# Patient Record
Sex: Male | Born: 1970 | Race: White | Hispanic: No | Marital: Married | Smoking: Never smoker
Health system: Southern US, Community
[De-identification: ages and names within clinical notes are randomized; demographics above are authoritative.]

## PROBLEM LIST (undated history)

## (undated) DIAGNOSIS — N529 Male erectile dysfunction, unspecified: Secondary | ICD-10-CM

## (undated) DIAGNOSIS — E669 Obesity, unspecified: Secondary | ICD-10-CM

## (undated) DIAGNOSIS — R7301 Impaired fasting glucose: Secondary | ICD-10-CM

## (undated) DIAGNOSIS — I10 Essential (primary) hypertension: Secondary | ICD-10-CM

## (undated) DIAGNOSIS — G4733 Obstructive sleep apnea (adult) (pediatric): Secondary | ICD-10-CM

## (undated) DIAGNOSIS — R748 Abnormal levels of other serum enzymes: Secondary | ICD-10-CM

## (undated) DIAGNOSIS — E785 Hyperlipidemia, unspecified: Secondary | ICD-10-CM

## (undated) DIAGNOSIS — E039 Hypothyroidism, unspecified: Secondary | ICD-10-CM

## (undated) HISTORY — DX: Abnormal levels of other serum enzymes: R74.8

## (undated) HISTORY — DX: Hyperlipidemia, unspecified: E78.5

## (undated) HISTORY — DX: Impaired fasting glucose: R73.01

## (undated) HISTORY — DX: Male erectile dysfunction, unspecified: N52.9

## (undated) HISTORY — DX: Essential (primary) hypertension: I10

## (undated) HISTORY — DX: Obstructive sleep apnea (adult) (pediatric): G47.33

## (undated) HISTORY — DX: Hypothyroidism, unspecified: E03.9

## (undated) HISTORY — DX: Obesity, unspecified: E66.9

---

## 2003-11-13 ENCOUNTER — Encounter: Admission: RE | Admit: 2003-11-13 | Discharge: 2003-11-13 | Payer: Self-pay | Admitting: Family Medicine

## 2010-12-05 ENCOUNTER — Emergency Department (HOSPITAL_COMMUNITY)
Admission: EM | Admit: 2010-12-05 | Discharge: 2010-12-05 | Disposition: A | Discharge status: Home / Self Care | Payer: BC Managed Care – PPO | Attending: Emergency Medicine | Admitting: Emergency Medicine

## 2010-12-05 DIAGNOSIS — H8110 Benign paroxysmal vertigo, unspecified ear: Secondary | ICD-10-CM | POA: Insufficient documentation

## 2010-12-05 DIAGNOSIS — E7800 Pure hypercholesterolemia, unspecified: Secondary | ICD-10-CM | POA: Insufficient documentation

## 2010-12-05 DIAGNOSIS — I10 Essential (primary) hypertension: Secondary | ICD-10-CM | POA: Insufficient documentation

## 2010-12-05 DIAGNOSIS — H9209 Otalgia, unspecified ear: Secondary | ICD-10-CM | POA: Insufficient documentation

## 2010-12-05 DIAGNOSIS — Z79899 Other long term (current) drug therapy: Secondary | ICD-10-CM | POA: Insufficient documentation

## 2010-12-05 DIAGNOSIS — H9190 Unspecified hearing loss, unspecified ear: Secondary | ICD-10-CM | POA: Insufficient documentation

## 2010-12-05 DIAGNOSIS — R262 Difficulty in walking, not elsewhere classified: Secondary | ICD-10-CM | POA: Insufficient documentation

## 2010-12-05 DIAGNOSIS — H5500 Unspecified nystagmus: Secondary | ICD-10-CM | POA: Insufficient documentation

## 2010-12-05 DIAGNOSIS — R112 Nausea with vomiting, unspecified: Secondary | ICD-10-CM | POA: Insufficient documentation

## 2010-12-05 DIAGNOSIS — E039 Hypothyroidism, unspecified: Secondary | ICD-10-CM | POA: Insufficient documentation

## 2011-08-30 ENCOUNTER — Encounter: Payer: Self-pay | Admitting: Family Medicine

## 2011-08-31 ENCOUNTER — Ambulatory Visit (INDEPENDENT_AMBULATORY_CARE_PROVIDER_SITE_OTHER): Payer: BC Managed Care – PPO | Admitting: Family Medicine

## 2011-08-31 ENCOUNTER — Encounter: Payer: Self-pay | Admitting: Family Medicine

## 2011-08-31 VITALS — BP 128/82 | HR 84 | Ht 70.0 in | Wt 310.0 lb

## 2011-08-31 DIAGNOSIS — N529 Male erectile dysfunction, unspecified: Secondary | ICD-10-CM

## 2011-08-31 DIAGNOSIS — E7800 Pure hypercholesterolemia, unspecified: Secondary | ICD-10-CM

## 2011-08-31 DIAGNOSIS — E039 Hypothyroidism, unspecified: Secondary | ICD-10-CM

## 2011-08-31 DIAGNOSIS — I10 Essential (primary) hypertension: Secondary | ICD-10-CM

## 2011-08-31 LAB — COMPREHENSIVE METABOLIC PANEL
ALT: 39 U/L (ref 0–53)
AST: 38 U/L — ABNORMAL HIGH (ref 0–37)
Albumin: 4.7 g/dL (ref 3.5–5.2)
Alkaline Phosphatase: 50 U/L (ref 39–117)
Potassium: 4.2 mEq/L (ref 3.5–5.3)
Sodium: 138 mEq/L (ref 135–145)
Total Bilirubin: 0.9 mg/dL (ref 0.3–1.2)
Total Protein: 7.2 g/dL (ref 6.0–8.3)

## 2011-08-31 LAB — LIPID PANEL
Cholesterol: 195 mg/dL (ref 0–200)
Triglycerides: 149 mg/dL (ref <150)

## 2011-08-31 NOTE — Progress Notes (Signed)
Patient presents to re-establish care.  He has no specific concerns, other than needing bloodwork.  He will be needing refills soon (not sure), but has at least a month left of his current medications (gets from [facility], 90 day). He has been doing Crossfit, and on a diet of fruits, vegetables, nuts and seeds and meat--limited processed foods, sugars and carbs. He has lost 15 pounds so far, being back on this diet for 3 weeks. In the past he has gotten down to 288 with same diet.  Hypertension follow-up:  Blood pressures elsewhere are not being checked.  Denies dizziness, headaches, chest pain.  Denies side effects of medications. He used to have a BP monitor, but it broke. He plans to get a new one.  Hyperlipidemia follow-up:  Patient is reportedly following a low-fat, low cholesterol diet--but only for the last 3 weeks.  Compliant with medications and denies medication side effects.   He has had elevated liver enzymes in the past (felt to be due to fatty liver, ranging 40's to 60's, never higher).  Last check was normal in 04/2011.  Last lipids:  Chol 176, TG 123, LDL 118, HDL 35, ratio 5.03  Hypothyroidism follow-up:  Patient is compliant with medications, takes pill along with his other medications, and eats shortly afterwards.  Takes his vitamin after eating.  Denies hair/skin changes. Intentional weight loss.  Energy is good, no change in bowels  Past Medical History  Diagnosis Date  . Hypertension   . Hyperlipidemia   . ED (erectile dysfunction)   . Obesity   . Impaired fasting glucose   . Unspecified hypothyroidism   . Elevated liver enzymes     fatty liver on u/s in past    History reviewed. No pertinent past surgical history.  History   Social History  . Marital Status: Married    Spouse Name: N/A    Number of Children: 1  . 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 Smoking status: Never Smoker   .  Smokeless tobacco: Never Used  . Alcohol Use: Yes     2 drinks per year.  . Drug Use: No  . Sexually Active: Yes -- Male partner(s)   Other Topics Concern  . Not on file   Social History Narrative   Lives with wife, daughter and 1 dog    Family History  Problem Relation Age of Onset  . Cancer Mother     BREAST  . Hypertension Mother   . Hyperlipidemia Mother   . Cancer Paternal Grandfather     LUNG   . Diabetes Paternal Grandfather   . Hyperlipidemia Father   . Hypertension Father     Current outpatient prescriptions:atorvastatin (LIPITOR) 20 MG tablet, Take 1 tablet by mouth Daily., Disp: , Rfl: ;  fluticasone (FLONASE) 50 MCG/ACT nasal spray, Place 2 sprays into the nose daily.  , Disp: , Rfl: ;  levothyroxine (SYNTHROID, LEVOTHROID) 88 MCG tablet, Take 88 mcg by mouth daily.  , Disp: , Rfl: ;  lisinopril (PRINIVIL,ZESTRIL) 20 MG tablet, Take 20 mg by mouth daily.  , Disp: , Rfl:  Multiple Vitamins-Minerals (MULTIVITAMIN WITH MINERALS) tablet, Take 1 tablet by mouth daily.  , Disp: , Rfl: ;  sildenafil (VIAGRA) 100 MG tablet, Take 100 mg by mouth daily as needed.  , Disp: , Rfl:   No Known Allergies  ROS:  Denies fever, URI symptoms, thyroid  symptoms, headaches, dizziness, chest pain, shortness of breath, edema, skin concerns, depression or other problems.  See HPI  PHYSICAL EXAM: BP 128/82  Pulse 84  Ht 5\' 10"  (1.778 m)  Wt 310 lb (140.615 kg)  BMI 44.48 kg/m2 Pleasant, obese male in no distress HEENT: PERRL, EOMI, conjunctiva clear. OP clear Neck: no lymphadenopathy or thyromegaly Heart: regular rate and rhythm without murmur Lungs: clear bilaterally Back: no spine or CVA tenderness Abdomen: obese, soft, nontender, nondistended, no organomegaly or mass Extremities: no edema, 2+ pulse Skin: without rash Psych: normal mood, affect, hygiene and grooming  ASSESSMENT/PLAN: 1. Essential hypertension, benign  Comprehensive metabolic panel  2. Pure  hypercholesterolemia  Lipid panel  3. Unspecified hypothyroidism  TSH  4. Erectile dysfunction     Continue current meds, refill when needed  If lipids borderline, pt would like to give it 3 more months of dietary trial, since diet only changed about 3 weeks ago

## 2011-09-01 ENCOUNTER — Other Ambulatory Visit: Payer: Self-pay | Admitting: Family Medicine

## 2011-09-01 ENCOUNTER — Encounter: Payer: Self-pay | Admitting: Family Medicine

## 2011-09-01 DIAGNOSIS — E7800 Pure hypercholesterolemia, unspecified: Secondary | ICD-10-CM

## 2012-01-23 ENCOUNTER — Telehealth: Payer: Self-pay | Admitting: Family Medicine

## 2012-01-23 DIAGNOSIS — E7800 Pure hypercholesterolemia, unspecified: Secondary | ICD-10-CM

## 2012-01-23 DIAGNOSIS — I10 Essential (primary) hypertension: Secondary | ICD-10-CM

## 2012-01-23 DIAGNOSIS — N529 Male erectile dysfunction, unspecified: Secondary | ICD-10-CM

## 2012-01-23 DIAGNOSIS — E039 Hypothyroidism, unspecified: Secondary | ICD-10-CM

## 2012-01-23 MED ORDER — LISINOPRIL 20 MG PO TABS: 20.0000 mg | ORAL_TABLET | Freq: Every day | ORAL | Status: DC

## 2012-01-23 MED ORDER — ATORVASTATIN CALCIUM 20 MG PO TABS: 20.0000 mg | ORAL_TABLET | Freq: Every day | ORAL | Status: DC

## 2012-01-23 MED ORDER — LEVOTHYROXINE SODIUM 88 MCG PO TABS: 88.0000 ug | ORAL_TABLET | Freq: Every day | ORAL | Status: DC

## 2012-01-23 MED ORDER — SILDENAFIL CITRATE 100 MG PO TABS: 100.0000 mg | ORAL_TABLET | Freq: Every day | ORAL | Status: DC | PRN

## 2012-01-23 NOTE — Telephone Encounter (Signed)
Pt has a new member [facility] for [facility] [phone]  Pt needs refills on lipitor 20 mg levothyroxine 88 mcg lisinopril 20 mg and viagra 100 mg these [facility] he also stated that the [facility] needs to be [facility]. Pt states that  rx needs to be denied by [facility] first and then his ins will pay for a local pharm.

## 2012-01-23 NOTE — Telephone Encounter (Signed)
Review of chart shows that he was due to have his lipids repeated the end of February (has orders in computer).  He needs to schedule lab appt. rx's were sent as he requested.

## 2012-01-25 ENCOUNTER — Telehealth: Payer: Self-pay | Admitting: *Deleted

## 2012-01-25 NOTE — Telephone Encounter (Signed)
Called patient and he scheduled fasting lab appt for 8:30am 01/27/12.

## 2012-01-27 ENCOUNTER — Other Ambulatory Visit: Payer: BC Managed Care – PPO

## 2012-01-27 DIAGNOSIS — E7800 Pure hypercholesterolemia, unspecified: Secondary | ICD-10-CM

## 2012-01-28 LAB — HEPATIC FUNCTION PANEL
ALT: 46 U/L (ref 0–53)
Bilirubin, Direct: 0.1 mg/dL (ref 0.0–0.3)
Total Bilirubin: 0.4 mg/dL (ref 0.3–1.2)

## 2012-01-28 LAB — LIPID PANEL
Cholesterol: 278 mg/dL — ABNORMAL HIGH (ref 0–200)
LDL Cholesterol: 199 mg/dL — ABNORMAL HIGH (ref 0–99)
Total CHOL/HDL Ratio: 5.1 Ratio
VLDL: 25 mg/dL (ref 0–40)

## 2012-01-30 ENCOUNTER — Other Ambulatory Visit: Payer: Self-pay | Admitting: Internal Medicine

## 2012-01-30 ENCOUNTER — Telehealth: Payer: Self-pay | Admitting: Family Medicine

## 2012-01-30 DIAGNOSIS — E039 Hypothyroidism, unspecified: Secondary | ICD-10-CM

## 2012-01-30 DIAGNOSIS — I10 Essential (primary) hypertension: Secondary | ICD-10-CM

## 2012-01-30 DIAGNOSIS — E7800 Pure hypercholesterolemia, unspecified: Secondary | ICD-10-CM

## 2012-01-30 DIAGNOSIS — N529 Male erectile dysfunction, unspecified: Secondary | ICD-10-CM

## 2012-01-30 MED ORDER — LISINOPRIL 20 MG PO TABS: 20.0000 mg | ORAL_TABLET | Freq: Every day | ORAL | Status: DC

## 2012-01-30 MED ORDER — LEVOTHYROXINE SODIUM 88 MCG PO TABS: 88.0000 ug | ORAL_TABLET | Freq: Every day | ORAL | Status: DC

## 2012-01-30 MED ORDER — ATORVASTATIN CALCIUM 20 MG PO TABS: 20.0000 mg | ORAL_TABLET | Freq: Every day | ORAL | Status: DC

## 2012-01-30 NOTE — Telephone Encounter (Signed)
Pt aware that meds were sent to pharmacy for 3 week supply

## 2012-01-30 NOTE — Telephone Encounter (Signed)
LM

## 2012-01-30 NOTE — Telephone Encounter (Signed)
The [facility] was [facility] (he had asked me to send to both pharmacies with his original request).  3 weeks sent. We had generic in system for synthroid, make sure that is okay, and if so, sign rx's that are pended.  Thanks

## 2012-02-29 ENCOUNTER — Encounter: Payer: Self-pay | Admitting: Family Medicine

## 2012-02-29 ENCOUNTER — Ambulatory Visit (INDEPENDENT_AMBULATORY_CARE_PROVIDER_SITE_OTHER): Payer: BC Managed Care – PPO | Admitting: Family Medicine

## 2012-02-29 VITALS — BP 118/82 | HR 76 | Ht 70.0 in | Wt 317.0 lb

## 2012-02-29 DIAGNOSIS — E669 Obesity, unspecified: Secondary | ICD-10-CM

## 2012-02-29 DIAGNOSIS — B351 Tinea unguium: Secondary | ICD-10-CM

## 2012-02-29 DIAGNOSIS — E7800 Pure hypercholesterolemia, unspecified: Secondary | ICD-10-CM

## 2012-02-29 DIAGNOSIS — E039 Hypothyroidism, unspecified: Secondary | ICD-10-CM

## 2012-02-29 DIAGNOSIS — I10 Essential (primary) hypertension: Secondary | ICD-10-CM

## 2012-02-29 NOTE — Patient Instructions (Signed)
Return fasting in 6 weeks.  If liver tests are okay, and medications aren't being changed, we can consider trying oral lamisil (antifungal medication). If liver tests are elevated, I'd hold off on trying that.  Please make sure to take your medications every day.  Get at least 30-60 minutes of exercise daily, and follow a proper diet--small portion sizes, lowfat.

## 2012-02-29 NOTE — Progress Notes (Signed)
Chief Complaint  Patient presents with  . Med Check    6 month med check for HTN, pure hyperchol and hypothyroidism. Patient is fasting.   HPI: Hypertension follow-up:  Blood pressures are not checked elsewhere.  Denies dizziness, headaches, chest pain, edema.  Denies side effects of medications.  Hyperlipidemia follow-up:  Patient is reportedly following a low-fat, low cholesterol diet.  Only recently compliant with medications and denies medication side effects.  Last lipids were 4/26, when he had been out of his Lipitor (for about 3-4 weeks, LDL 199), as been back on med for about a month--as he initially stated, but then recalls that he didn't take it for two weeks while on vacation in [place] earlier this month. Prior to that, LDL was 129 and HDL 36 in November.  Hypothyroidism:  Last TSH was 2.604 in November.  He has gained 7 pounds since last visit.  Some dry patches of skin, and intermittent constipation  He reports having gotten down to 282 with Whole 30 diet and Crossfit.  He admits to getting in cycles of being compliant with diet and exercise, and then goes back to his normal routine.  Currently he is only walking the dog.  He doesn't remember taking any of his medications while he was in [place] for two weeks (May 4-17).    Left foot rash for years.  Nails on that foot are also brittle.  Antifungal creams made it worse.  Recalls being told about possibly considering oral antifungals, but liver tests had been elevated.  Last LFT's were normal, but he hadn't been on statin med at the time.  Lab Results  Component Value Date   CHOL 278* 01/27/2012   HDL 54 01/27/2012   LDLCALC 199* 01/27/2012   TRIG 125 01/27/2012   CHOLHDL 5.1 01/27/2012   Past Medical History  Diagnosis Date  . Hypertension   . Hyperlipidemia   . ED (erectile dysfunction)   . Obesity   . Impaired fasting glucose   . Unspecified hypothyroidism   . Elevated liver enzymes     fatty liver on u/s in past   History    Social History  . Marital Status: Married    Spouse Name: N/A    Number of Children: 1  . 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 Smoking status: Never Smoker   . Smokeless tobacco: Never Used  . Alcohol Use: Yes     2 drinks per year.  . Drug Use: No  . Sexually Active: Yes -- Male partner(s)   Other Topics Concern  . Not on file   Social History Narrative   Lives with wife, daughter and 1 dog   Current Outpatient Prescriptions on File Prior to Visit  Medication Sig Dispense Refill  . atorvastatin (LIPITOR) 20 MG tablet Take 1 tablet (20 mg total) by mouth daily.  21 tablet  0  . levothyroxine (SYNTHROID, LEVOTHROID) 88 MCG tablet Take 1 tablet (88 mcg total) by mouth daily.  21 tablet  0  . lisinopril (PRINIVIL,ZESTRIL) 20 MG tablet Take 1 tablet (20 mg total) by mouth daily.  21 tablet  0  . Multiple Vitamins-Minerals (MULTIVITAMIN WITH MINERALS) tablet Take 1 tablet by mouth daily.        . sildenafil (VIAGRA) 100 MG tablet Take 1 tablet (100 mg total) by mouth daily as needed.  10 tablet  11   No Known  Allergies  ROS:  Denies chest pain, headaches, palpitations, shortness of breath, edema.  +weight gain, constipation.  See HPI.  No fevers, URI symptoms  PHYSICAL EXAM: BP 118/82  Pulse 76  Ht 5\' 10"  (1.778 m)  Wt 317 lb (143.79 kg)  BMI 45.48 kg/m2 Well developed, pleasant obese male in no distress Neck: no lymphadenopathy or thyromegaly Heart: regular rate and rhythm.  Rate of 96 at time of MD exam. No murmur Lungs: clear bilaterally Abdomen: soft, obese, nontender Extremities: no edema, 2+ pulse. 1st and 5th nails on L foot discolored. Some flaking/dryness at heel, somewhat of a sandalfoot distribution but sparing metatarsal region. Skin otherwise free of rash.  ASSESSMENT/PLAN:  1. Essential hypertension, benign    2. Pure hypercholesterolemia  Lipid panel, Hepatic function panel  3.  Obesity  Glucose, random  4. Onychomycosis  Glucose, random  5. Unspecified hypothyroidism  TSH   Avoid checking labs today due to the possibility of 2 weeks of missed meds during the last month while on vacation.  Return in 6 weeks for TSH, lipids, LFTS.  Discussed healthy diet and lifestyle changes, rather than a "diet" per se.  Counseling given.  Possible tinea pedis and onychomycosis, failed OTC antifungal treatments.  Consider treatment with oral antifungals presumptively (given toenail appearance) vs skin scraping of dry skin at heel.  Had some elevated LFT's in past (most recent normal, but hadn't been on Lipitor)--await LFT's for treatment.  25 minutes face to face visit

## 2012-04-11 ENCOUNTER — Other Ambulatory Visit: Payer: BC Managed Care – PPO

## 2012-04-11 DIAGNOSIS — E039 Hypothyroidism, unspecified: Secondary | ICD-10-CM

## 2012-04-11 DIAGNOSIS — E7800 Pure hypercholesterolemia, unspecified: Secondary | ICD-10-CM

## 2012-04-11 DIAGNOSIS — E669 Obesity, unspecified: Secondary | ICD-10-CM

## 2012-04-11 DIAGNOSIS — B351 Tinea unguium: Secondary | ICD-10-CM

## 2012-04-11 LAB — LIPID PANEL
Cholesterol: 181 mg/dL (ref 0–200)
HDL: 31 mg/dL — ABNORMAL LOW (ref >39)
Triglycerides: 132 mg/dL (ref <150)
VLDL: 26 mg/dL (ref 0–40)

## 2012-04-11 LAB — HEPATIC FUNCTION PANEL
ALT: 34 U/L (ref 0–53)
Albumin: 4.6 g/dL (ref 3.5–5.2)
Alkaline Phosphatase: 50 U/L (ref 39–117)
Indirect Bilirubin: 0.6 mg/dL (ref 0.0–0.9)
Total Protein: 7.2 g/dL (ref 6.0–8.3)

## 2012-04-11 LAB — GLUCOSE, RANDOM: Glucose, Bld: 78 mg/dL (ref 70–99)

## 2012-04-16 ENCOUNTER — Encounter: Payer: Self-pay | Admitting: Family Medicine

## 2012-04-16 ENCOUNTER — Ambulatory Visit (INDEPENDENT_AMBULATORY_CARE_PROVIDER_SITE_OTHER): Payer: BC Managed Care – PPO | Admitting: Family Medicine

## 2012-04-16 VITALS — BP 124/90 | HR 68 | Ht 70.0 in | Wt 303.0 lb

## 2012-04-16 DIAGNOSIS — E7800 Pure hypercholesterolemia, unspecified: Secondary | ICD-10-CM

## 2012-04-16 NOTE — Progress Notes (Signed)
Chief Complaint  Patient presents with  . Follow-up    lab follow up, fasting.   HPI:  Patient presents to follow up on recent lipids.  Has been following a Paleo diet (low carb, admits to still not eating as much vegetables as he is supposed to), eating bacon daily. Eating red meat and eggs frequently. He has not been exercising. He has continued to lose weight, and feels good.  Past Medical History  Diagnosis Date  . Hypertension   . Hyperlipidemia   . ED (erectile dysfunction)   . Obesity   . Impaired fasting glucose   . Unspecified hypothyroidism   . Elevated liver enzymes     fatty liver on u/s in past   History   Social History  . Marital Status: Married    Spouse Name: N/A    Number of Children: 1  . 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 Smoking status: Never Smoker   . Smokeless tobacco: Never Used  . Alcohol Use: Yes     2 drinks per year.  . Drug Use: No  . Sexually Active: Yes -- Male partner(s)   Other Topics Concern  . Not on file   Social History Narrative   Lives with wife, daughter and 1 dog    Current Outpatient Prescriptions on File Prior to Visit  Medication Sig Dispense Refill  . atorvastatin (LIPITOR) 20 MG tablet Take 1 tablet (20 mg total) by mouth daily.  21 tablet  0  . levothyroxine (SYNTHROID, LEVOTHROID) 88 MCG tablet Take 1 tablet (88 mcg total) by mouth daily.  21 tablet  0  . lisinopril (PRINIVIL,ZESTRIL) 20 MG tablet Take 1 tablet (20 mg total) by mouth daily.  21 tablet  0  . Multiple Vitamins-Minerals (MULTIVITAMIN WITH MINERALS) tablet Take 1 tablet by mouth daily.        . sildenafil (VIAGRA) 100 MG tablet Take 1 tablet (100 mg total) by mouth daily as needed.  10 tablet  11   No Known Allergies  ROS:  Denies fever, URI symptoms, headaches, chest pain, myalgias, or other concerns  PHYSICAL EXAM: BP 124/90  Pulse 68  Ht 5\' 10"  (1.778 m)  Wt 303 lb  (137.44 kg)  BMI 43.48 kg/m2 Well developed, pleasant male, in no distress. Weight is down 14 pounds from last visit 02/29/12.  Lab Results  Component Value Date   CHOL 181 04/11/2012   HDL 31* 04/11/2012   LDLCALC 124* 04/11/2012   TRIG 132 04/11/2012   CHOLHDL 5.8 04/11/2012  chol/HDL ratio 5.8  Lab Results  Component Value Date   ALT 34 04/11/2012   AST 24 04/11/2012   ALKPHOS 50 04/11/2012   BILITOT 0.8 04/11/2012   ASSESSMENT/PLAN: 1. Pure hypercholesterolemia    Counseled extensively regarding diet and exercise.  He is losing weight on current diet by significantly decreasing his portion sizes and eliminating carbs, but still following high cholesterol diet (and high sodium, affecting his BP today). Discussed LDL, HDL at length.  Recommended doing [facility] panel, to help determine next step in treatment--either increasing lipitor, vs adding niaspan vs adding intestinal agents, depending on the results (particle size, etc.)  Encouraged daily exercise, low sodium diet, discussed some lower cholesterol high protein options.  Discussed fish in diet vs fish oil.  Will contact patient and adjust meds based on results.  He doesn't seem very willing to  make changes to his Paleo-like diet, given the weight loss results he has seen.  30 minute visit, all counseling.

## 2012-04-27 ENCOUNTER — Telehealth: Payer: Self-pay | Admitting: Family Medicine

## 2012-04-27 DIAGNOSIS — E039 Hypothyroidism, unspecified: Secondary | ICD-10-CM

## 2012-04-27 DIAGNOSIS — I10 Essential (primary) hypertension: Secondary | ICD-10-CM

## 2012-04-27 DIAGNOSIS — N529 Male erectile dysfunction, unspecified: Secondary | ICD-10-CM

## 2012-04-27 DIAGNOSIS — E7800 Pure hypercholesterolemia, unspecified: Secondary | ICD-10-CM

## 2012-04-27 MED ORDER — LEVOTHYROXINE SODIUM 88 MCG PO TABS: 88.0000 ug | ORAL_TABLET | Freq: Every day | ORAL | Status: DC

## 2012-04-27 MED ORDER — SILDENAFIL CITRATE 100 MG PO TABS: 100.0000 mg | ORAL_TABLET | Freq: Every day | ORAL | Status: DC | PRN

## 2012-04-27 MED ORDER — ATORVASTATIN CALCIUM 40 MG PO TABS: 40.0000 mg | ORAL_TABLET | Freq: Every day | ORAL | Status: DC

## 2012-04-27 MED ORDER — LISINOPRIL 20 MG PO TABS: 20.0000 mg | ORAL_TABLET | Freq: Every day | ORAL | Status: DC

## 2012-04-27 NOTE — Telephone Encounter (Signed)
[facility] results just came in--I haven't had a chance to fully review, but recall that chart of overproducer vs overabsorber was in the middle.  Since he is only on low dose lipitor, trial of increase to 40mg .  Schedule repeat lipids and lfts for 2 months.  Rx's sent to pharmacy.  He should have a lot of refills left on Viagra at local pharmacy, but [facility] per his request. I will review results in more detail on Monday--there was a book of results that is for him (unless they sent him a separate copy)

## 2012-04-27 NOTE — Telephone Encounter (Signed)
Pt needs refills on lipitor, lisinopril, viagra and levothyroxine  [facility] Pt states he has about a week of lipitor left, he stated he thinks you are waiting for [facility] results

## 2012-04-30 ENCOUNTER — Encounter: Payer: Self-pay | Admitting: Family Medicine

## 2012-04-30 ENCOUNTER — Telehealth: Payer: Self-pay | Admitting: *Deleted

## 2012-04-30 NOTE — Telephone Encounter (Signed)
Left messages on patient's home and cell number asking him to return my call.

## 2012-04-30 NOTE — Telephone Encounter (Signed)
Think this is for you.  

## 2012-05-01 ENCOUNTER — Encounter: Payer: Self-pay | Admitting: Family Medicine

## 2012-05-02 ENCOUNTER — Telehealth: Payer: Self-pay | Admitting: *Deleted

## 2012-05-02 ENCOUNTER — Other Ambulatory Visit: Payer: Self-pay | Admitting: *Deleted

## 2012-05-02 DIAGNOSIS — Z79899 Other long term (current) drug therapy: Secondary | ICD-10-CM

## 2012-05-02 DIAGNOSIS — E7800 Pure hypercholesterolemia, unspecified: Secondary | ICD-10-CM

## 2012-05-02 NOTE — Telephone Encounter (Signed)
Spoke with patient and went over lab results. He scheduled fasting lab appt for 07/04/12 @ 8:30am.

## 2012-05-15 ENCOUNTER — Telehealth: Payer: Self-pay

## 2012-05-15 NOTE — Telephone Encounter (Signed)
Pt got results from [facility] insulin level was 16 according to them that is high risk and he has not been eating sugar other fruits and vegetables and would like to know if any of his med's would be causing this

## 2012-05-15 NOTE — Telephone Encounter (Signed)
You can advise pt that elevated insulin level is a sign of prediabetes, insulin resistance.  Statins can raise a sugar only slightly--the most likely cause of elevated insulin level/pre-diabetes is his weight.  He should continue to avoid sugars/ carbs, exercise daily and continue to lose weight.

## 2012-05-15 NOTE — Telephone Encounter (Signed)
Pt was called back and informed word for word and verbalized understanding

## 2012-06-06 ENCOUNTER — Telehealth: Payer: Self-pay | Admitting: *Deleted

## 2012-06-06 MED ORDER — TADALAFIL 5 MG PO TABS: 5.0000 mg | ORAL_TABLET | Freq: Every day | ORAL | Status: DC

## 2012-06-06 NOTE — Telephone Encounter (Signed)
Patient called and states that he was taking Cialis daily and then it was switched to Viagra. Viagra is costing him $500 for 3 month rx. Can you please switch back to Cialis daily and send to [facility]? Thanks.

## 2012-06-06 NOTE — Telephone Encounter (Signed)
Spoke with patient and he verified that he was taking 5mg  daily. E rx'd #90 with 3 refills per Dr.Knapp to [facility].

## 2012-06-06 NOTE — Telephone Encounter (Signed)
cialis hasn't been rx'd since being on computer.  Was he taking 2.5mg  daily or 5mg  daily?  Please confirm dose, and okay to send for 90 days with 3 refills to pharmacy

## 2012-07-04 ENCOUNTER — Other Ambulatory Visit: Payer: BC Managed Care – PPO

## 2012-07-05 ENCOUNTER — Other Ambulatory Visit: Payer: BC Managed Care – PPO

## 2012-07-05 DIAGNOSIS — Z79899 Other long term (current) drug therapy: Secondary | ICD-10-CM

## 2012-07-05 DIAGNOSIS — E7800 Pure hypercholesterolemia, unspecified: Secondary | ICD-10-CM

## 2012-07-05 LAB — HEPATIC FUNCTION PANEL
Bilirubin, Direct: 0.1 mg/dL (ref 0.0–0.3)
Total Bilirubin: 0.6 mg/dL (ref 0.3–1.2)

## 2012-07-05 LAB — LIPID PANEL
Total CHOL/HDL Ratio: 3.7 Ratio
VLDL: 34 mg/dL (ref 0–40)

## 2012-07-06 ENCOUNTER — Encounter: Payer: Self-pay | Admitting: Family Medicine

## 2012-07-18 ENCOUNTER — Other Ambulatory Visit (INDEPENDENT_AMBULATORY_CARE_PROVIDER_SITE_OTHER): Payer: BC Managed Care – PPO

## 2012-07-18 DIAGNOSIS — Z23 Encounter for immunization: Secondary | ICD-10-CM

## 2012-10-13 ENCOUNTER — Other Ambulatory Visit: Payer: Self-pay | Admitting: Family Medicine

## 2012-10-16 ENCOUNTER — Telehealth: Payer: Self-pay | Admitting: Family Medicine

## 2012-10-16 DIAGNOSIS — I10 Essential (primary) hypertension: Secondary | ICD-10-CM

## 2012-10-16 DIAGNOSIS — E7800 Pure hypercholesterolemia, unspecified: Secondary | ICD-10-CM

## 2012-10-16 NOTE — Telephone Encounter (Signed)
PT STATES HE WAS TO COME IN TO FOLLOW UP ON HIS "LEVELS". I SCHEDULED A VISIT FOR HIM ON FEB 5. HE STATES THAT HE WOULD LIKE TO COME IN THAT Monday TO HAVE HIS LABS DRAWN. PLEASE INFORM PT IF THIS IS OK AND ORDERS NEED TO BE PUT IN SYSTEM.

## 2012-10-16 NOTE — Telephone Encounter (Signed)
He is due for OV (has been 6 months since his last OV, due for med check on HTN and lipids).  Lab orders were entered so that he can come prior to visit and get fasting labs done.

## 2012-11-05 ENCOUNTER — Other Ambulatory Visit: Payer: BC Managed Care – PPO

## 2012-11-07 ENCOUNTER — Ambulatory Visit: Payer: BC Managed Care – PPO | Admitting: Family Medicine

## 2012-12-10 ENCOUNTER — Other Ambulatory Visit: Payer: BC Managed Care – PPO

## 2012-12-12 ENCOUNTER — Ambulatory Visit: Payer: BC Managed Care – PPO | Admitting: Family Medicine

## 2013-01-14 ENCOUNTER — Other Ambulatory Visit: Payer: BC Managed Care – PPO

## 2013-01-16 ENCOUNTER — Ambulatory Visit: Payer: BC Managed Care – PPO | Admitting: Family Medicine

## 2013-02-18 ENCOUNTER — Encounter: Payer: Self-pay | Admitting: Family Medicine

## 2013-03-15 ENCOUNTER — Other Ambulatory Visit: Payer: Self-pay | Admitting: Family Medicine

## 2013-03-18 ENCOUNTER — Other Ambulatory Visit: Payer: Self-pay | Admitting: *Deleted

## 2013-03-18 ENCOUNTER — Telehealth: Payer: Self-pay | Admitting: Family Medicine

## 2013-03-18 DIAGNOSIS — E7800 Pure hypercholesterolemia, unspecified: Secondary | ICD-10-CM

## 2013-03-18 DIAGNOSIS — I10 Essential (primary) hypertension: Secondary | ICD-10-CM

## 2013-03-19 ENCOUNTER — Telehealth: Payer: Self-pay | Admitting: Family Medicine

## 2013-03-19 NOTE — Telephone Encounter (Signed)
LM

## 2013-03-20 ENCOUNTER — Other Ambulatory Visit: Payer: 59

## 2013-03-20 DIAGNOSIS — E7800 Pure hypercholesterolemia, unspecified: Secondary | ICD-10-CM

## 2013-03-20 DIAGNOSIS — I10 Essential (primary) hypertension: Secondary | ICD-10-CM

## 2013-03-20 LAB — LIPID PANEL
Cholesterol: 192 mg/dL (ref 0–200)
HDL: 38 mg/dL — ABNORMAL LOW (ref >39)
Triglycerides: 196 mg/dL — ABNORMAL HIGH (ref <150)

## 2013-03-20 LAB — COMPREHENSIVE METABOLIC PANEL
BUN: 15 mg/dL (ref 6–23)
CO2: 25 mEq/L (ref 19–32)
Calcium: 9.1 mg/dL (ref 8.4–10.5)
Chloride: 102 mEq/L (ref 96–112)
Creat: 0.92 mg/dL (ref 0.50–1.35)
Glucose, Bld: 100 mg/dL — ABNORMAL HIGH (ref 70–99)
Total Bilirubin: 0.8 mg/dL (ref 0.3–1.2)

## 2013-04-08 ENCOUNTER — Ambulatory Visit: Payer: Self-pay | Admitting: Family Medicine

## 2013-04-15 ENCOUNTER — Ambulatory Visit (INDEPENDENT_AMBULATORY_CARE_PROVIDER_SITE_OTHER): Payer: 59 | Admitting: Family Medicine

## 2013-04-15 ENCOUNTER — Encounter: Payer: Self-pay | Admitting: Family Medicine

## 2013-04-15 VITALS — BP 140/70 | HR 76 | Ht 70.0 in | Wt 331.0 lb

## 2013-04-15 DIAGNOSIS — E039 Hypothyroidism, unspecified: Secondary | ICD-10-CM

## 2013-04-15 DIAGNOSIS — R7301 Impaired fasting glucose: Secondary | ICD-10-CM

## 2013-04-15 DIAGNOSIS — E7800 Pure hypercholesterolemia, unspecified: Secondary | ICD-10-CM

## 2013-04-15 DIAGNOSIS — R7989 Other specified abnormal findings of blood chemistry: Secondary | ICD-10-CM

## 2013-04-15 DIAGNOSIS — I10 Essential (primary) hypertension: Secondary | ICD-10-CM

## 2013-04-15 DIAGNOSIS — E782 Mixed hyperlipidemia: Secondary | ICD-10-CM

## 2013-04-15 NOTE — Progress Notes (Signed)
Chief Complaint  Patient presents with  . Hypertension    nonfasting med check.    Hypertension follow-up:  Blood pressures are not checked elsewhere.  Denies dizziness, headaches, chest pain.  Denies side effects of medications.  Admits that diet has been poor, maybe having more salt, not watching sodium intake.  Hyperlipidemia follow-up:  Patient is compliant with medications and denies medication side effects.  Admits to poor diet, not following any particular lowfat or low cholesterol diet.  Admits to having an "all or nothing" mentality, and is currently not on any program. Recently he has been getting little to no exercise.  He actually recently saw psychologist to address these issues (all or nothing mentality, per pt).  Past Medical History  Diagnosis Date  . Hypertension   . Hyperlipidemia   . ED (erectile dysfunction)   . Obesity   . Impaired fasting glucose   . Unspecified hypothyroidism   . Elevated liver enzymes     fatty liver on u/s in past   History reviewed. No pertinent past surgical history. History   Social History  . Marital Status: Married    Spouse Name: N/A    Number of Children: 1  . 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 Smoking status: Never Smoker   . Smokeless tobacco: Never Used  . Alcohol Use: Yes     Comment: 2 drinks per year.  . Drug Use: No  . Sexually Active: Yes -- Male partner(s)   Other Topics Concern  . Not on file   Social History Narrative   Lives with wife, daughter and 1 dog   Current Outpatient Prescriptions on File Prior to Visit  Medication Sig Dispense Refill  . atorvastatin (LIPITOR) 40 MG tablet TAKE 1 TABLET DAILY  90 tablet  0  . levothyroxine (SYNTHROID, LEVOTHROID) 88 MCG tablet Take 1 tablet (88 mcg total) by mouth daily.  90 tablet  3  . lisinopril (PRINIVIL,ZESTRIL) 20 MG tablet Take 1 tablet (20 mg total) by mouth daily.  90 tablet  3  .  sildenafil (VIAGRA) 100 MG tablet Take 1 tablet (100 mg total) by mouth daily as needed.  30 tablet  3  . Multiple Vitamins-Minerals (MULTIVITAMIN WITH MINERALS) tablet Take 1 tablet by mouth daily.         No current facility-administered medications on file prior to visit.   No Known Allergies  ROS:  Denies headaches, dizziness, chest pain, edema, fevers, URI symptoms, skin rashes, GI complaints, bleeding, bruising or other concerns.  PHYSICAL EXAM: BP 140/70  Pulse 76  Ht 5\' 10"  (1.778 m)  Wt 331 lb (150.141 kg)  BMI 47.49 kg/m2 Morbidly obese, pleasant male in no distress Neck: no lymphadenopathy, thyromegaly or bruit Heart: regular rate and rhythm without murmur Lungs: clear bilaterally Abdomen: soft, obese, no mass, nontender Extremities: no edema, 2+ pulse Neuro: alert and oriented.  Normal gait, cranial nerves intact Psych: normal mood, affect, hygiene and grooming  Lab Results  Component Value Date   CHOL 192 03/20/2013   HDL 38* 03/20/2013   LDLCALC 115* 03/20/2013   TRIG 196* 03/20/2013   CHOLHDL 5.1 03/20/2013     Chemistry      Component Value Date/Time   NA 137 03/20/2013 0839   K 4.1 03/20/2013 0839   CL 102 03/20/2013 0839   CO2 25 03/20/2013 0839   BUN 15 03/20/2013 0839  CREATININE 0.92 03/20/2013 0839      Component Value Date/Time   CALCIUM 9.1 03/20/2013 0839   ALKPHOS 50 03/20/2013 0839   AST 38* 03/20/2013 0839   ALT 65* 03/20/2013 0839   BILITOT 0.8 03/20/2013 0839     Glucose 100  ASSESSMENT/PLAN:  Unspecified hypothyroidism - Plan: TSH  Impaired fasting glucose - Plan: Hemoglobin A1c, Glucose, random  Elevated LFTs - Plan: Hepatic function panel  Mixed hyperlipidemia - Plan: Lipid panel  Pure hypercholesterolemia  Essential hypertension, benign  Noncompliant with diet, exercise.  Discussed lifestyle modification that needs to be continued longterm, rather than being "on" a program, that he then gets "off".  Counseling will likely benefit  him, especially with upcoming stressor of only child leaving for college next month.  Counseled extensively re: diet, the need for daily exercise (at least 30 mins daily, even if not doing Crossfit or a particular program).  Over 25 min visit, mor than 1/2 spent in counseling. Refer to nutritionist, and f/u in 3 months  Include recent lipids and also [facility] with referral to [place] (or [facility])

## 2013-04-15 NOTE — Patient Instructions (Addendum)
Juan Santiago vs Juan Santiago for nutrition referral.  We will do referral--if [facility] doesn't take your insurance, will refer to [facility]. We need to work on LIFESTYLE modifications, rather than "diets".  Follow low sodium diet, and exercise at least 30 minutes daily.

## 2013-04-16 LAB — HEMOGLOBIN A1C: Hgb A1c MFr Bld: 5.1 % (ref <5.7)

## 2013-04-16 LAB — TSH: TSH: 3.681 u[IU]/mL (ref 0.350–4.500)

## 2013-05-20 ENCOUNTER — Ambulatory Visit: Payer: Self-pay | Admitting: Specialist

## 2013-05-20 LAB — COMPREHENSIVE METABOLIC PANEL
Alkaline Phosphatase: 62 U/L (ref 50–136)
Anion Gap: 7 (ref 7–16)
BUN: 10 mg/dL (ref 7–18)
Bilirubin,Total: 0.6 mg/dL (ref 0.2–1.0)
Calcium, Total: 9 mg/dL (ref 8.5–10.1)
Co2: 25 mmol/L (ref 21–32)
Glucose: 100 mg/dL — ABNORMAL HIGH (ref 65–99)
Potassium: 3.9 mmol/L (ref 3.5–5.1)
SGPT (ALT): 58 U/L (ref 12–78)
Sodium: 137 mmol/L (ref 136–145)
Total Protein: 7.5 g/dL (ref 6.4–8.2)

## 2013-05-20 LAB — CBC WITH DIFFERENTIAL/PLATELET
Basophil %: 0.4 %
Eosinophil %: 1.8 %
HGB: 16.3 g/dL (ref 13.0–18.0)
Lymphocyte #: 3.6 10*3/uL (ref 1.0–3.6)
MCH: 30.9 pg (ref 26.0–34.0)
MCHC: 35.6 g/dL (ref 32.0–36.0)
MCV: 87 fL (ref 80–100)
Monocyte #: 0.7 x10 3/mm (ref 0.2–1.0)
Monocyte %: 7.1 %
Neutrophil %: 56.9 %
RBC: 5.27 10*6/uL (ref 4.40–5.90)
RDW: 13.2 % (ref 11.5–14.5)
WBC: 10.6 10*3/uL (ref 3.8–10.6)

## 2013-05-20 LAB — IRON AND TIBC
Iron Bind.Cap.(Total): 306 ug/dL (ref 250–450)
Iron: 119 ug/dL (ref 65–175)
Unbound Iron-Bind.Cap.: 187 ug/dL

## 2013-05-20 LAB — TSH: Thyroid Stimulating Horm: 2.99 u[IU]/mL

## 2013-05-20 LAB — AMYLASE: Amylase: 41 U/L (ref 25–115)

## 2013-05-20 LAB — BILIRUBIN, DIRECT: Bilirubin, Direct: 0.1 mg/dL (ref 0.00–0.20)

## 2013-05-20 LAB — FOLATE: Folic Acid: 16.4 ng/mL (ref 3.1–100.0)

## 2013-05-20 LAB — APTT: Activated PTT: 27.1 secs (ref 23.6–35.9)

## 2013-05-20 LAB — PROTIME-INR: Prothrombin Time: 12.5 secs (ref 11.5–14.7)

## 2013-05-20 LAB — MAGNESIUM: Magnesium: 2 mg/dL

## 2013-05-20 LAB — LIPASE, BLOOD: Lipase: 175 U/L (ref 73–393)

## 2013-05-23 DIAGNOSIS — G4733 Obstructive sleep apnea (adult) (pediatric): Secondary | ICD-10-CM

## 2013-05-23 HISTORY — DX: Obstructive sleep apnea (adult) (pediatric): G47.33

## 2013-05-28 ENCOUNTER — Ambulatory Visit: Payer: Self-pay | Admitting: Specialist

## 2013-06-03 ENCOUNTER — Ambulatory Visit: Payer: Self-pay | Admitting: Specialist

## 2013-06-03 HISTORY — PX: ESOPHAGOGASTRODUODENOSCOPY: SHX1529

## 2013-06-17 ENCOUNTER — Encounter: Payer: Self-pay | Admitting: Family Medicine

## 2013-06-17 DIAGNOSIS — G4733 Obstructive sleep apnea (adult) (pediatric): Secondary | ICD-10-CM | POA: Insufficient documentation

## 2013-07-03 ENCOUNTER — Telehealth: Payer: Self-pay | Admitting: Internal Medicine

## 2013-07-03 NOTE — Telephone Encounter (Signed)
I'm happy to review his labs.  If the same tests that are ordered were done, we don't necessarily need to repeat.  LFT's were also ordered.  Await results to review.

## 2013-07-03 NOTE — Telephone Encounter (Signed)
Pt notified and will have to fill out a form for [place] to get those lab results.

## 2013-07-03 NOTE — Telephone Encounter (Signed)
Pt states he had a bunch of lab work done recently and took like 7 vials of blood but only knows of A1c and possibly thinks cholestrol. He is going to be sending over the lab results and he wants to know if you can look at them and find out if he still has to come in to get lab work drawn on 07/15/13.

## 2013-07-15 ENCOUNTER — Other Ambulatory Visit (INDEPENDENT_AMBULATORY_CARE_PROVIDER_SITE_OTHER): Payer: 59

## 2013-07-15 DIAGNOSIS — E782 Mixed hyperlipidemia: Secondary | ICD-10-CM

## 2013-07-15 DIAGNOSIS — Z23 Encounter for immunization: Secondary | ICD-10-CM

## 2013-07-15 DIAGNOSIS — R7301 Impaired fasting glucose: Secondary | ICD-10-CM

## 2013-07-15 LAB — LIPID PANEL
Cholesterol: 159 mg/dL (ref 0–200)
HDL: 36 mg/dL — ABNORMAL LOW (ref >39)
Total CHOL/HDL Ratio: 4.4 Ratio

## 2013-07-15 LAB — HEPATIC FUNCTION PANEL
ALT: 73 U/L — ABNORMAL HIGH (ref 0–53)
AST: 44 U/L — ABNORMAL HIGH (ref 0–37)
Albumin: 4.6 g/dL (ref 3.5–5.2)
Alkaline Phosphatase: 47 U/L (ref 39–117)
Bilirubin, Direct: 0.2 mg/dL (ref 0.0–0.3)
Indirect Bilirubin: 0.8 mg/dL (ref 0.0–0.9)
Total Bilirubin: 1 mg/dL (ref 0.3–1.2)

## 2013-07-15 LAB — GLUCOSE, RANDOM: Glucose, Bld: 93 mg/dL (ref 70–99)

## 2013-07-18 ENCOUNTER — Encounter: Payer: Self-pay | Admitting: Family Medicine

## 2013-07-18 ENCOUNTER — Ambulatory Visit (INDEPENDENT_AMBULATORY_CARE_PROVIDER_SITE_OTHER): Payer: 59 | Admitting: Family Medicine

## 2013-07-18 VITALS — BP 130/86 | HR 72 | Ht 70.0 in | Wt 342.0 lb

## 2013-07-18 DIAGNOSIS — R7989 Other specified abnormal findings of blood chemistry: Secondary | ICD-10-CM

## 2013-07-18 DIAGNOSIS — Z23 Encounter for immunization: Secondary | ICD-10-CM

## 2013-07-18 DIAGNOSIS — E7800 Pure hypercholesterolemia, unspecified: Secondary | ICD-10-CM

## 2013-07-18 DIAGNOSIS — I10 Essential (primary) hypertension: Secondary | ICD-10-CM

## 2013-07-18 NOTE — Progress Notes (Signed)
Chief Complaint  Patient presents with  . Hypertension    nonfasting med check ,labs already done.     Didn't see the nutritionist because insurance didn't cover.  He continued to see the psychologist, and attended a seminar with bariatric surgeons (in [place]). He has seen their nutritionist and their psychologist.  Had EGD with biopsy, EKG, lots of bloodwork, ultrasound of abdomen (confirmed fatty liver).  He had sleep study, diagnosed with OSD (obstructive sleep disorder) and treated with CPAP--doesn't notice any difference (pressure of 8 per pt) in how he feels since using machine.  He is on a "liver reduction diet"--shakes and products from [employer], with a lean meat and leafy green at night, x 4 weeks, then full liquid diet the week prior to surgery.  Surgery scheduled for 08/12/13.  He is having single anastomosis duodenal bypass with sleeve. Insurance requires him to have surgery at [facility])  Still getting individual counseling for anxiety, stress, coping  Past Medical History  Diagnosis Date  . Hypertension   . Hyperlipidemia   . ED (erectile dysfunction)   . Obesity   . Impaired fasting glucose   . Unspecified hypothyroidism   . Elevated liver enzymes     fatty liver on u/s in past   Past Surgical History  Procedure Laterality Date  . Esophagogastroduodenoscopy  06/2013    Dr. Smitty Cords [facility], [place])   Current outpatient prescriptions:atorvastatin (LIPITOR) 40 MG tablet, TAKE 1 TABLET DAILY, Disp: 90 tablet, Rfl: 0;  levothyroxine (SYNTHROID, LEVOTHROID) 88 MCG tablet, Take 1 tablet (88 mcg total) by mouth daily., Disp: 90 tablet, Rfl: 3;  lisinopril (PRINIVIL,ZESTRIL) 20 MG tablet, Take 1 tablet (20 mg total) by mouth daily., Disp: 90 tablet, Rfl: 3 Multiple Vitamins-Minerals (MULTIVITAMIN WITH MINERALS) tablet, Take 1 tablet by mouth daily.  , Disp: , Rfl: ;  sildenafil (VIAGRA) 100 MG tablet, Take 1 tablet (100 mg total) by mouth daily as  needed., Disp: 30 tablet, Rfl: 3  No Known Allergies  ROS:  Denies headaches, dizziness, chest pain, palpitations, shortness of breath, bleeding, bruising, rashes.  +stress.  No GI complaints.  No fevers, URI symptoms, cough. +weight gain since last visit  PHYSICAL EXAM: BP 130/86  Pulse 72  Ht 5\' 10"  (1.778 m)  Wt 342 lb (155.13 kg)  BMI 49.07 kg/m2 Pleasant, morbidly obese male in no distress Neck: no lymphadenopathy, thyromegaly or mass Heart: regular rate and rhythm without murmur Lungs: clear bilaterally Extremities: no edema Abdomen: obese, nontender  Lab Results  Component Value Date   CHOL 159 07/15/2013   HDL 36* 07/15/2013   LDLCALC 87 07/15/2013   TRIG 179* 07/15/2013   CHOLHDL 4.4 07/15/2013   Lab Results  Component Value Date   ALT 73* 07/15/2013   AST 44* 07/15/2013   ALKPHOS 47 07/15/2013   BILITOT 1.0 07/15/2013   Glucose 93  Lab Results  Component Value Date   TSH 3.681 04/15/2013   ASSESSMENT/PLAN:  Pure hypercholesterolemia - improved/stable.  borderline TG and low HDL.  LDL at goal.  continue current medication  Essential hypertension, benign - controlled  Elevated LFTs - related to fatty liver (confirmed on u/s, per pt)  Morbid obesity - bariatric surgery scheduled for next month   F/u 6 months for CPE Expect bariatric clinic to f/u on blood pressure, labs, etc so routine f/u not needed here until physical.  He will let us know if this is not the case

## 2013-07-23 ENCOUNTER — Telehealth: Payer: Self-pay | Admitting: Internal Medicine

## 2013-07-23 NOTE — Telephone Encounter (Signed)
Left a message for pt to call back

## 2013-07-23 NOTE — Telephone Encounter (Signed)
Pt states he called [facility] to get a bone density test done and they told him he would need a referral. He would like to get it done at [facility]

## 2013-07-23 NOTE — Telephone Encounter (Signed)
This was not being ordered by me; it is being requested by his bariatric physicians (it is really an order he needs, not a referral).  Records came in, but I haven't had a chance to review them yet. He can get order from the provider who is recommending the test, using the proper diagnosis code so that insurance covers it.  He can get the order rx from whomever, and still have the test done at [facility] (he can get written order, or they can fax it, doesn't have to be done thru computer).  If I order it, I will get the results, vs the person who is requesting the results (and also they will know the dx code to use to get it covered)

## 2013-07-23 NOTE — Telephone Encounter (Signed)
Pt notified that it needs to come from the provider who is requesting it. And to make sure they put the right dx code for insurance to cover it.

## 2013-08-07 ENCOUNTER — Encounter: Payer: Self-pay | Admitting: *Deleted

## 2013-08-12 HISTORY — PX: LAPAROSCOPIC GASTRIC SLEEVE RESECTION: SHX5895

## 2013-08-13 ENCOUNTER — Encounter: Payer: Self-pay | Admitting: Family Medicine

## 2013-08-14 ENCOUNTER — Encounter: Payer: Self-pay | Admitting: Family Medicine

## 2014-01-02 ENCOUNTER — Encounter: Payer: Self-pay | Admitting: Family Medicine

## 2014-01-02 ENCOUNTER — Ambulatory Visit (INDEPENDENT_AMBULATORY_CARE_PROVIDER_SITE_OTHER): Payer: 59 | Admitting: Family Medicine

## 2014-01-02 VITALS — BP 124/72 | HR 76 | Ht 70.0 in | Wt 281.0 lb

## 2014-01-02 DIAGNOSIS — R945 Abnormal results of liver function studies: Secondary | ICD-10-CM

## 2014-01-02 DIAGNOSIS — Z9884 Bariatric surgery status: Secondary | ICD-10-CM

## 2014-01-02 DIAGNOSIS — R7989 Other specified abnormal findings of blood chemistry: Secondary | ICD-10-CM

## 2014-01-02 DIAGNOSIS — E7800 Pure hypercholesterolemia, unspecified: Secondary | ICD-10-CM

## 2014-01-02 DIAGNOSIS — I10 Essential (primary) hypertension: Secondary | ICD-10-CM

## 2014-01-02 DIAGNOSIS — E039 Hypothyroidism, unspecified: Secondary | ICD-10-CM

## 2014-01-02 LAB — LIPID PANEL
CHOLESTEROL: 242 mg/dL — AB (ref 0–200)
HDL: 34 mg/dL — ABNORMAL LOW (ref >39)
LDL Cholesterol: 172 mg/dL — ABNORMAL HIGH (ref 0–99)
Total CHOL/HDL Ratio: 7.1 Ratio
Triglycerides: 179 mg/dL — ABNORMAL HIGH (ref <150)
VLDL: 36 mg/dL (ref 0–40)

## 2014-01-02 LAB — CBC WITH DIFFERENTIAL/PLATELET
Basophils Absolute: 0 10*3/uL (ref 0.0–0.1)
Basophils Relative: 0 % (ref 0–1)
Eosinophils Absolute: 0.2 10*3/uL (ref 0.0–0.7)
Eosinophils Relative: 2 % (ref 0–5)
HEMATOCRIT: 43.7 % (ref 39.0–52.0)
HEMOGLOBIN: 15.9 g/dL (ref 13.0–17.0)
Lymphocytes Relative: 45 % (ref 12–46)
Lymphs Abs: 3.6 10*3/uL (ref 0.7–4.0)
MCH: 30.2 pg (ref 26.0–34.0)
MCHC: 36.4 g/dL — ABNORMAL HIGH (ref 30.0–36.0)
MCV: 82.9 fL (ref 78.0–100.0)
MONO ABS: 0.6 10*3/uL (ref 0.1–1.0)
MONOS PCT: 8 % (ref 3–12)
NEUTROS ABS: 3.6 10*3/uL (ref 1.7–7.7)
Neutrophils Relative %: 45 % (ref 43–77)
Platelets: 279 10*3/uL (ref 150–400)
RBC: 5.27 MIL/uL (ref 4.22–5.81)
RDW: 13.3 % (ref 11.5–15.5)
WBC: 8.1 10*3/uL (ref 4.0–10.5)

## 2014-01-02 LAB — COMPREHENSIVE METABOLIC PANEL
ALBUMIN: 4.4 g/dL (ref 3.5–5.2)
ALT: 69 U/L — ABNORMAL HIGH (ref 0–53)
AST: 46 U/L — ABNORMAL HIGH (ref 0–37)
Alkaline Phosphatase: 66 U/L (ref 39–117)
BUN: 10 mg/dL (ref 6–23)
CALCIUM: 9.2 mg/dL (ref 8.4–10.5)
CO2: 23 meq/L (ref 19–32)
Chloride: 104 mEq/L (ref 96–112)
Creat: 0.69 mg/dL (ref 0.50–1.35)
GLUCOSE: 82 mg/dL (ref 70–99)
POTASSIUM: 3.7 meq/L (ref 3.5–5.3)
Sodium: 138 mEq/L (ref 135–145)
Total Bilirubin: 0.8 mg/dL (ref 0.2–1.2)
Total Protein: 6.9 g/dL (ref 6.0–8.3)

## 2014-01-02 LAB — AMYLASE: Amylase: 29 U/L (ref 0–105)

## 2014-01-02 LAB — PHOSPHORUS: Phosphorus: 3.9 mg/dL (ref 2.3–4.6)

## 2014-01-02 LAB — MAGNESIUM: Magnesium: 2 mg/dL (ref 1.5–2.5)

## 2014-01-02 LAB — IRON: IRON: 101 ug/dL (ref 42–165)

## 2014-01-02 NOTE — Patient Instructions (Signed)
Keep up the good work.  Try and start exercising 30 minutes daily.  We will be in touch with your results, and to let you know if you need to restart cholesterol or thyroid medication

## 2014-01-02 NOTE — Progress Notes (Signed)
Chief Complaint  Patient presents with  . Advice Only    requesting "Initial Bariatric Panel" form that he brought in. Also brought in a list that he has done some research on that he would like to have done in addition.    Bariatric surgery was November 10th.  He has lost about 70 pounds so far.  Denies any problems, side effects.  Only can tolerate smaller volumes of food.  Stools are looser consistency than previously, but no actual diarrhea.  No floating, foul-smelling foods.  No nausea or vomiting.  He brings in long list of labs that he is requesting--gotten from support groups. He also brings in prescription for labs==sheet from [facility]--he actuallly needs "post-op" bariatric panel, rather than the initial panel (he didn't realize the boxes were on the left of the desired tests)  He has been off Lipitor since the surgery, as recommended by his surgeons.  He tries to continue to make healthy choices, follow low cholesterol diet. Eggs make him nauseated, so not eating.  Having smaller portions of all foods, so cut back on cholesterol in diet.  He tends to eat more carbs than he would like, because he can tolerate it better (ie bagels). He is not currently getting any regular exercise.  Hypothyroidism:  He was told to stop his medication after surgery.  He denies any symptoms.  His original symptom related to thyroid was heart palpitations.  He denies skin/hair/mood/energy changes.  +weight loss due to surgery, as well as bowel changes related to surgery/diet.  Hypertension:  He has been off lisinopril since surgery.  He doesn't check his BP elsewhere.  Denies headaches, dizziness, chest pain.  Past Medical History  Diagnosis Date  . Hypertension   . Hyperlipidemia   . ED (erectile dysfunction)   . Obesity   . Impaired fasting glucose   . Unspecified hypothyroidism   . Elevated liver enzymes     fatty liver on u/s in past  . OSA (obstructive sleep apnea)  05/23/13    severe OSA-sleep study 05/23/13   Past Surgical History  Procedure Laterality Date  . Esophagogastroduodenoscopy  06/2013    Dr. Smitty Cords [facility], [place]),gastritis  . 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 Marital Status: Married    Spouse Name: N/A    Number of Children: 1  . 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 Smoking status: Never Smoker   . Smokeless tobacco: Never Used  . Alcohol Use: Yes     Comment: 2 drinks per year.  . Drug Use: No  . Sexual Activity: Yes    Partners: Female   Other Topics Concern  . Not on file   Social History Narrative   Lives with wife, daughter and 1 dog   Outpatient Encounter Prescriptions as of 01/02/2014  Medication Sig Note  . b complex vitamins tablet Take 1 tablet by mouth daily.   . Calcium-Magnesium-Vitamin D (CALCIUM 500 PO) Take 2 tablets by mouth daily.   . Cholecalciferol (VITAMIN D3 PO) Take 5,000 Int'l Units by mouth daily. 01/02/2014: Patch form  . Multiple Vitamins-Minerals (MULTIVITAMIN WITH MINERALS) tablet Take 1 tablet by mouth daily.     . vitamin B-12 (CYANOCOBALAMIN) 100 MCG tablet Take 100 mcg by mouth daily.   Marland Kitchen atorvastatin (LIPITOR) 40 MG tablet TAKE  1 TABLET DAILY 01/02/2014: Stopped after surgery in November  . levothyroxine (SYNTHROID, LEVOTHROID) 88 MCG tablet Take 1 tablet (88 mcg total) by mouth daily. 01/02/2014: He was told to stop his thyroid medication after surgery--none since November  . lisinopril (PRINIVIL,ZESTRIL) 20 MG tablet Take 1 tablet (20 mg total) by mouth daily. 01/02/2014: Stopped after bariatric surgery  . sildenafil (VIAGRA) 100 MG tablet Take 1 tablet (100 mg total) by mouth daily as needed. 01/02/2014: Not taking since prior to surgery   No Known Allergies  ROS:  +head congestion, runny nose recently.  Denies fevers, chills, cough, shortness of breath,  chest pain, nausea, vomiting, heartburn, black or bloody stools, diarrhea, urinary complaints.  Denies bleeding, bruising, joint pains, depression, anxiety, hair/skin changes, depression, anxiety or other complaints.  See HPI.  PHYSICAL EXAM: BP 124/72  Pulse 76  Ht 5\' 10"  (1.778 m)  Wt 281 lb (127.461 kg)  BMI 40.32 kg/m2 Well developed, pleasant, obese male in no distress HEENT:  PERRL, EOMI, conjunctiva clear, OP clear Neck: no lymphadenopathy, thyromegaly or mass Heart: regular rate and rhythm without murmur Lungs: clear bilaterally Abdomen: soft, nontender Extremities: no edema Neuro: alert and oriented, normal strength, gait Psych: normal mood, affect, hygiene and grooming Skin: no rash, bruising  ASSESSMENT/PLAN:  Essential hypertension, benign - Plan: Comprehensive metabolic panel  Pure hypercholesterolemia - Plan: Comprehensive metabolic panel, Lipid panel  Unspecified hypothyroidism - Plan: TSH  Elevated LFTs - Plan: Comprehensive metabolic panel  Bariatric surgery status - Plan: Comprehensive metabolic panel, CBC with Differential, Magnesium, Iron, Phosphorus, Folate, Vitamin B12, Vit D  25 hydroxy (rtn osteoporosis monitoring), Ferritin, Prealbumin, Vitamin B1, Amylase  Morbid obesity  Hypothyroidism--if TSH is abnormal, likely can restart at a lower dose (given lower weight). Hyperlipidemia--if cholesterol is improved and restarting meds aren't indicated, plan to recheck in 6 mos, when diet may be different HTN--well controlled, off medications  We reviewed recommended/indicated labs in detail, and why many are inappropriate that he was requesting (if screening tests are normal--ie ceruloplasmin, copper).  He understands (spent over 10 minutes reviewing the tests he was asking for, and why certain ones weren't appropriate), and he is comfortable with the plan to just do the f/u tests as recommended by his bariatric surgeons.  If he has more specific concerns and  requests for tests based on his concerns for malabsorption, he can address these with his surgeon.

## 2014-01-03 ENCOUNTER — Other Ambulatory Visit: Payer: Self-pay | Admitting: Family Medicine

## 2014-01-03 LAB — TSH: TSH: 2.49 u[IU]/mL (ref 0.350–4.500)

## 2014-01-03 LAB — VITAMIN D 25 HYDROXY (VIT D DEFICIENCY, FRACTURES): VIT D 25 HYDROXY: 31 ng/mL (ref 30–89)

## 2014-01-03 LAB — PREALBUMIN: Prealbumin: 21.4 mg/dL (ref 17.0–34.0)

## 2014-01-03 LAB — VITAMIN B12: VITAMIN B 12: 1808 pg/mL — AB (ref 211–911)

## 2014-01-03 LAB — FERRITIN: Ferritin: 285 ng/mL (ref 22–322)

## 2014-01-03 LAB — FOLATE: Folate: 15.8 ng/mL

## 2014-01-03 NOTE — Addendum Note (Signed)
Addended byJoselyn Arrow: Meilani Edmundson on: 01/03/2014 12:06 AM   Modules accepted: Orders

## 2014-01-06 LAB — VITAMIN B1: VITAMIN B1 (THIAMINE): 14 nmol/L (ref 8–30)

## 2014-01-13 ENCOUNTER — Telehealth: Payer: Self-pay | Admitting: Family Medicine

## 2014-01-13 NOTE — Telephone Encounter (Signed)
Ok to cancel.  Only thing that really isn't being addressed is his prostate exam, but that can wait. He can change his October visit to a physical instead

## 2014-01-13 NOTE — Telephone Encounter (Signed)
Pt states that he is unsure if he needs to keep cpe appt. He questions if it is still necessary due to the fact that he has had appts with you and now has a recheck in October. Pt states that he has to pay 100% of visits until deductible met. He would like to cancel if not completely necessary. Please advise pt.

## 2014-01-13 NOTE — Telephone Encounter (Signed)
Pt informed and appt changed to cpe per Dr. Delford FieldKnapp's instructions.

## 2014-01-16 ENCOUNTER — Encounter: Payer: 59 | Admitting: Family Medicine

## 2014-05-28 ENCOUNTER — Other Ambulatory Visit: Payer: Self-pay | Admitting: Family Medicine

## 2014-07-01 ENCOUNTER — Telehealth: Payer: Self-pay | Admitting: Family Medicine

## 2014-07-01 DIAGNOSIS — Z131 Encounter for screening for diabetes mellitus: Secondary | ICD-10-CM

## 2014-07-01 NOTE — Telephone Encounter (Signed)
Future order entered for fasting sugar

## 2014-07-01 NOTE — Telephone Encounter (Signed)
Pt called and stated he needs his blood sugar also tested for his job and wants that added to his lab orders

## 2014-07-07 ENCOUNTER — Telehealth: Payer: Self-pay | Admitting: Family Medicine

## 2014-07-07 ENCOUNTER — Telehealth: Payer: Self-pay | Admitting: *Deleted

## 2014-07-07 NOTE — Telephone Encounter (Signed)
Pt has appt on Thursday and wants to know if you want him to come in tomorrow for labs?  Pt ph [phone]

## 2014-07-07 NOTE — Telephone Encounter (Signed)
He has future orders in for lipids and LFT's that were due in July (3 month f/u after restarting lipitor).  He also has fasting sugar in system (due now).  Unless he has rx for labs that are needed related to his bariatric surgery, this is all I need at this point.  If he is having symptoms/problems, we can add labs on at his visit

## 2014-07-07 NOTE — Telephone Encounter (Signed)
Patient scheduled for CPE Thursday- Called to see if he can come in for labs tomorrow-saw some future orders in system, wasn't sure what else, if anything he will need. Please advise.

## 2014-07-07 NOTE — Telephone Encounter (Signed)
Sent to Dr.Knapp and will call him back once she answers me.

## 2014-07-07 NOTE — Telephone Encounter (Signed)
Called patient and scheduled him for tomorrow am for these 3 tests.

## 2014-07-08 ENCOUNTER — Other Ambulatory Visit: Payer: 59

## 2014-07-08 DIAGNOSIS — R7989 Other specified abnormal findings of blood chemistry: Secondary | ICD-10-CM

## 2014-07-08 DIAGNOSIS — E7800 Pure hypercholesterolemia, unspecified: Secondary | ICD-10-CM

## 2014-07-08 DIAGNOSIS — Z131 Encounter for screening for diabetes mellitus: Secondary | ICD-10-CM

## 2014-07-08 DIAGNOSIS — R945 Abnormal results of liver function studies: Secondary | ICD-10-CM

## 2014-07-08 LAB — LIPID PANEL
Cholesterol: 145 mg/dL (ref 0–200)
HDL: 45 mg/dL (ref >39)
LDL CALC: 74 mg/dL (ref 0–99)
Total CHOL/HDL Ratio: 3.2 Ratio
Triglycerides: 128 mg/dL (ref <150)
VLDL: 26 mg/dL (ref 0–40)

## 2014-07-08 LAB — HEPATIC FUNCTION PANEL
ALK PHOS: 77 U/L (ref 39–117)
ALT: 50 U/L (ref 0–53)
AST: 37 U/L (ref 0–37)
Albumin: 4.5 g/dL (ref 3.5–5.2)
BILIRUBIN TOTAL: 0.9 mg/dL (ref 0.2–1.2)
Bilirubin, Direct: 0.2 mg/dL (ref 0.0–0.3)
Indirect Bilirubin: 0.7 mg/dL (ref 0.2–1.2)
Total Protein: 7.2 g/dL (ref 6.0–8.3)

## 2014-07-08 LAB — GLUCOSE, RANDOM: Glucose, Bld: 90 mg/dL (ref 70–99)

## 2014-07-10 ENCOUNTER — Ambulatory Visit (INDEPENDENT_AMBULATORY_CARE_PROVIDER_SITE_OTHER): Payer: 59 | Admitting: Family Medicine

## 2014-07-10 ENCOUNTER — Encounter: Payer: Self-pay | Admitting: Family Medicine

## 2014-07-10 VITALS — BP 118/80 | HR 74 | Ht 69.0 in | Wt 265.0 lb

## 2014-07-10 DIAGNOSIS — Z0000 Encounter for general adult medical examination without abnormal findings: Secondary | ICD-10-CM

## 2014-07-10 DIAGNOSIS — Z23 Encounter for immunization: Secondary | ICD-10-CM

## 2014-07-10 DIAGNOSIS — E7800 Pure hypercholesterolemia, unspecified: Secondary | ICD-10-CM

## 2014-07-10 DIAGNOSIS — I10 Essential (primary) hypertension: Secondary | ICD-10-CM

## 2014-07-10 DIAGNOSIS — E039 Hypothyroidism, unspecified: Secondary | ICD-10-CM

## 2014-07-10 MED ORDER — ATORVASTATIN CALCIUM 40 MG PO TABS: ORAL_TABLET | ORAL | Status: DC

## 2014-07-10 NOTE — Patient Instructions (Addendum)
Continue your current medication.  It is recommended that you get at least 30 minutes of aerobic exercise at least 5 days/week (for weight loss, you may need as much as 60-90 minutes). This can be any activity that gets your heart rate up. This can be divided in 10-15 minute intervals if needed, but try and build up your endurance at least once a week.  Weight bearing exercise is also recommended twice weekly.  Here is your information from your visit and labs:   BP 118/80  Pulse 74  Ht 5\' 9"  (1.753 m)  Wt 265 lb (120.203 kg)  BMI 39.12 kg/m2  Lab Results  Component Value Date   CHOL 145 07/08/2014   HDL 45 07/08/2014   LDLCALC 74 07/08/2014   TRIG 128 07/08/2014   CHOLHDL 3.2 07/08/2014   Lab Results  Component Value Date   ALT 50 07/08/2014   AST 37 07/08/2014   ALKPHOS 77 07/08/2014   BILITOT 0.9 07/08/2014   Fasting glucose 90

## 2014-07-10 NOTE — Progress Notes (Signed)
Chief Complaint  Patient presents with  . Annual Exam    patient was not aware he was having a cpe and he said he was not prepared he would like to talk to you first  . Hyperlipidemia   Patient presents to follow up on blood pressure and cholesterol. He is in need of a form for his insurance (does not require physical exam, just BP, height/weight/glucose).  When he called to schedule visit for form, we saw that he was due for physical.  Apparently he wasn't aware that physical was scheduled for today, is "not prepared" and prefers just to have med check.  He continues to lose weight s/p bariatric surgery.  He has lost another 16 pounds in the last 6 months.  He feels well, denies any complications or problems. No nausea, vomiting.  He has 1 year f/u with surgeons next month.  Hyperlipidemia:  lipitor was restarted after last labs--denies any side effects.  Following low-fat, low cholesterol diet.  He doesn't tolerate dairy well, so limits cheese/milk.  Rare red meat.  He doesn't tolerate tougher proteins (okay with filet mignon, dark meat chicken; doesn't do well with overcooked chicken breast).  So he admits to eating more carbs--easier to digest.  Hypertension:  He has been off blood pressure medication since just prior to his weight loss surgery.  Doesn't check BP elsewhere.  Denies headaches, dizziness, palpitations, chest pain, shortness of breath, edema  Hypothyroidism:  He has been off thyroid medication since prior to surgery. Denies any symptoms.  Past Medical History  Diagnosis Date  . Hypertension   . Hyperlipidemia   . ED (erectile dysfunction)   . Obesity   . Impaired fasting glucose   . Unspecified hypothyroidism   . Elevated liver enzymes     fatty liver on u/s in past  . OSA (obstructive sleep apnea) 05/23/13    severe OSA-sleep study 05/23/13   Past Surgical History  Procedure Laterality Date  . Esophagogastroduodenoscopy  06/2013    Dr. Darnell Level [facility], [place]),gastritis  .  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 Marital Status: Married    Spouse Name: N/A    Number of Children: 1  . Years of Education: N/A   Occupational History  . 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 Smoking status: Never Smoker   . Smokeless tobacco: Never Used  . Alcohol Use: Yes     Comment: 2 drinks per year.  . Drug Use: No  . Sexual Activity: Yes    Partners: Female   Other Topics Concern  . Not on file   Social History Narrative   Lives with wife, daughter and 1 dog   Current Outpatient Prescriptions on File Prior to Visit  Medication Sig Dispense Refill  . b complex vitamins tablet Take 1 tablet by mouth daily.      . Calcium-Magnesium-Vitamin D (CALCIUM 500 PO) Take 2 tablets by mouth daily.      . Cholecalciferol (VITAMIN D3 PO) Take 5,000 Int'l Units by mouth daily.      . Multiple Vitamins-Minerals (MULTIVITAMIN WITH MINERALS) tablet Take 1 tablet by mouth daily.         No current facility-administered medications on file prior to visit.  Atorvastatin 58m daily.  No Known Allergies  ROS:  Denies headaches, dizziness, numbness/tingling, syncope. He denies URI or allergy symptoms, cough, shortness of breath.  No chest pain, dysphagia, heartburn, bowel changes, GI or GU complaints. No bleeding, bruising, rashes. No depression/anxiety; denies pain, or any other concerns.  PHYSICAL EXAM:  BP 118/80  Pulse 74  Ht _0  (1.753 m)  Wt 265 lb (120.203 kg)  BMI 39.12 kg/m2  Well developed, pleasant, obese male in no distress HEENT: PERRL, EOMI, conjunctiva and sclera are clear. OP clear Neck: no lymphadenopathy, thyromegaly or mass Heart: regular rate and rhythm without murmur Lungs: clear bilaterally Back: no CVA tenderness Abdomen: soft, nontender, no organomegaly or mass Extremities: no edema Neuro: alert and oriented.  Cranial nerves intact. Normal gait,  strength Psych: normal mood, affect, hygiene and grooming Skin: no lesions/rashes noted.  Lab Results  Component Value Date   CHOL 145 07/08/2014   HDL 45 07/08/2014   LDLCALC 74 07/08/2014   TRIG 128 07/08/2014   CHOLHDL 3.2 07/08/2014   Lab Results  Component Value Date   ALT 50 07/08/2014   AST 37 07/08/2014   ALKPHOS 77 07/08/2014   BILITOT 0.9 07/08/2014   Glucose 90  ASSESSMENT/PLAN:  Pure hypercholesterolemia - well controlled on lipitor 58m; tolerating without side effects - Plan: atorvastatin (LIPITOR) 40 MG tablet  Need for prophylactic vaccination and inoculation against influenza - Plan: Flu Vaccine QUAD 36+ mos PF IM (Fluarix Quad PF)  Essential hypertension, benign - resolved s/p bariatric surgery/weight loss  Hypothyroidism, unspecified hypothyroidism type - off medications with normal TSH 6 months ago.  no symptoms to warrant recheck at this time. plan to check yearly (April), sooner prn sx  Reviewed low cholesterol diet in detail.  Ideally he should cut back on portions of carbs.  Regular exercise encouraged.   CPE in April, along with med check.  Fasting labs prior (c-met, lipid, TSH, CBC0  Pt to get uKoreacopies of any labs done through surgeons to avoid duplication

## 2015-01-09 ENCOUNTER — Other Ambulatory Visit: Payer: 59

## 2015-01-09 DIAGNOSIS — E039 Hypothyroidism, unspecified: Secondary | ICD-10-CM

## 2015-01-09 DIAGNOSIS — E7800 Pure hypercholesterolemia, unspecified: Secondary | ICD-10-CM

## 2015-01-09 DIAGNOSIS — I10 Essential (primary) hypertension: Secondary | ICD-10-CM

## 2015-01-09 DIAGNOSIS — Z0000 Encounter for general adult medical examination without abnormal findings: Secondary | ICD-10-CM

## 2015-01-09 LAB — CBC WITH DIFFERENTIAL/PLATELET
BASOS PCT: 0 % (ref 0–1)
Basophils Absolute: 0 10*3/uL (ref 0.0–0.1)
Eosinophils Absolute: 0.3 10*3/uL (ref 0.0–0.7)
Eosinophils Relative: 4 % (ref 0–5)
HEMATOCRIT: 44.1 % (ref 39.0–52.0)
Hemoglobin: 15.5 g/dL (ref 13.0–17.0)
Lymphocytes Relative: 37 % (ref 12–46)
Lymphs Abs: 2.8 10*3/uL (ref 0.7–4.0)
MCH: 30.2 pg (ref 26.0–34.0)
MCHC: 35.1 g/dL (ref 30.0–36.0)
MCV: 85.8 fL (ref 78.0–100.0)
MONOS PCT: 7 % (ref 3–12)
MPV: 9.3 fL (ref 8.6–12.4)
Monocytes Absolute: 0.5 10*3/uL (ref 0.1–1.0)
NEUTROS ABS: 4 10*3/uL (ref 1.7–7.7)
NEUTROS PCT: 52 % (ref 43–77)
Platelets: 245 10*3/uL (ref 150–400)
RBC: 5.14 MIL/uL (ref 4.22–5.81)
RDW: 13.5 % (ref 11.5–15.5)
WBC: 7.6 10*3/uL (ref 4.0–10.5)

## 2015-01-09 LAB — LIPID PANEL
CHOL/HDL RATIO: 3.6 ratio
CHOLESTEROL: 143 mg/dL (ref 0–200)
HDL: 40 mg/dL (ref >=40)
LDL Cholesterol: 82 mg/dL (ref 0–99)
Triglycerides: 105 mg/dL (ref <150)
VLDL: 21 mg/dL (ref 0–40)

## 2015-01-09 LAB — COMPREHENSIVE METABOLIC PANEL
ALBUMIN: 4.1 g/dL (ref 3.5–5.2)
ALT: 43 U/L (ref 0–53)
AST: 28 U/L (ref 0–37)
Alkaline Phosphatase: 64 U/L (ref 39–117)
BUN: 11 mg/dL (ref 6–23)
CALCIUM: 8.8 mg/dL (ref 8.4–10.5)
CO2: 26 mEq/L (ref 19–32)
Chloride: 99 mEq/L (ref 96–112)
Creat: 0.78 mg/dL (ref 0.50–1.35)
GLUCOSE: 81 mg/dL (ref 70–99)
Potassium: 3.9 mEq/L (ref 3.5–5.3)
Sodium: 137 mEq/L (ref 135–145)
Total Bilirubin: 0.8 mg/dL (ref 0.2–1.2)
Total Protein: 6.3 g/dL (ref 6.0–8.3)

## 2015-01-09 LAB — TSH: TSH: 3.899 u[IU]/mL (ref 0.350–4.500)

## 2015-01-12 ENCOUNTER — Encounter: Payer: Self-pay | Admitting: Family Medicine

## 2015-01-12 ENCOUNTER — Other Ambulatory Visit: Payer: Self-pay | Admitting: *Deleted

## 2015-01-12 ENCOUNTER — Ambulatory Visit (INDEPENDENT_AMBULATORY_CARE_PROVIDER_SITE_OTHER): Payer: 59 | Admitting: Family Medicine

## 2015-01-12 VITALS — BP 128/72 | HR 68 | Ht 69.5 in | Wt 252.2 lb

## 2015-01-12 DIAGNOSIS — E7800 Pure hypercholesterolemia, unspecified: Secondary | ICD-10-CM

## 2015-01-12 DIAGNOSIS — E039 Hypothyroidism, unspecified: Secondary | ICD-10-CM

## 2015-01-12 DIAGNOSIS — G4733 Obstructive sleep apnea (adult) (pediatric): Secondary | ICD-10-CM

## 2015-01-12 DIAGNOSIS — Z0000 Encounter for general adult medical examination without abnormal findings: Secondary | ICD-10-CM

## 2015-01-12 DIAGNOSIS — R7301 Impaired fasting glucose: Secondary | ICD-10-CM | POA: Diagnosis not present

## 2015-01-12 LAB — POCT URINALYSIS DIPSTICK
Bilirubin, UA: NEGATIVE
Blood, UA: NEGATIVE
Glucose, UA: NEGATIVE
Ketones, UA: NEGATIVE
LEUKOCYTES UA: NEGATIVE
Nitrite, UA: NEGATIVE
Protein, UA: NEGATIVE
Spec Grav, UA: 1.015
Urobilinogen, UA: NEGATIVE
pH, UA: 6

## 2015-01-12 MED ORDER — ATORVASTATIN CALCIUM 40 MG PO TABS: ORAL_TABLET | ORAL | Status: DC

## 2015-01-12 NOTE — Progress Notes (Signed)
Chief Complaint  Patient presents with  . Annual Exam    nonfasting annual exam/med check, labs already one. Did not do eye exam-he had one with Dr.Fox last week. No concerns.   Juan Santiago is a 44 y.o. male who presents for a complete physical.  He has the following concerns:  Obesity:  He has lost an additional 13 pounds over the last 6 months, with a total of about 90 pounds since his bariatric surgery 08/2013. He had a full panel of labs through the bariatric clinic within the last 4 months.  His Vitamin K was low, and he was started on supplement.  They also monitor the Vitamin D, B12.  OSA:  He is on CPAP.  He never noticed any different since starting treatment (never had daytime somnolence, waking unrefreshed).  He is compliant with use.  Hypertension: He has been off medications since bariatric surgery.  BPs have been normal at all doctor visits, doesn't check elsewhere. No headaches, chest pain, palpitations.  Hyperlipidemia: He resumed taking Lipitor after initially being taken off for bariatric surgery.  Admits to missing a dose about 2x/week. He is following a low cholesterol diet.  Hypothyroidism:  He has been off thyroid medication since the bariatric surgery 1.5 years ago.  Denies any fatigue, hair/skin/bowel/mood changes. He continues to lose weight.  (I can't assess hair loss--shaves head, he hasn't noticed any change)   Immunization History  Administered Date(s) Administered  . Influenza Split 07/18/2012  . Influenza Whole 08/23/2010, 07/29/2011  . Influenza,inj,Quad PF,36+ Mos 07/18/2013, 07/10/2014  . Pneumococcal Conjugate-13 05/08/1997, 08/19/1997  . Td 09/05/1996, 08/29/2010  . Tdap 03/31/2010  (pneumovax, not prevnar--will change in system).  Also, last Td was 08/29/97 per old records, not 2011--will change). Last colonoscopy: never Last PSA:  never Dentist:  Twice yearly Ophtho: yearly Exercise:  Walks the dog 1-2x/day (getting 5,000 steps), no regular  exercise otherwise since job promotion.  Past Medical History  Diagnosis Date  . Hypertension     off meds since 08/2013 (bariatric surgery)  . Hyperlipidemia   . ED (erectile dysfunction)   . Obesity   . Impaired fasting glucose   . Unspecified hypothyroidism     off meds since 08/2013 (bariatric surgery)  . Elevated liver enzymes     fatty liver on u/s in past  . OSA (obstructive sleep apnea) 05/23/13    severe OSA-sleep study 05/23/13; on CPAP    Past Surgical History  Procedure Laterality Date  . Esophagogastroduodenoscopy  06/2013    Dr. Smitty Cords [facility] [place]),gastritis  . Laparoscopic gastric sleeve resection  08/12/2013    with small bowel anastomosis    History   Social History  . Marital Status: Married    Spouse Name: N/A  . Number of Children: 1  . Years of Education: N/A   Occupational History  . 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 Smoking status: Never Smoker   . Smokeless tobacco: Never Used  . Alcohol Use: Yes     Comment: 2 drinks per year.  . Drug Use: No  . Sexual Activity:    Partners: Female   Other Topics Concern  . Not on file   Social History Narrative   Lives with wife, daughter (in college) and 1 dog    Family History  Problem Relation Age of Onset  . Cancer Mother     BREAST  . Hypertension Mother   .  Hyperlipidemia Mother   . Cancer Paternal Grandfather     LUNG   . Diabetes Paternal Grandfather   . Hyperlipidemia Father   . Hypertension Father   . Heart disease Maternal Grandmother   . Alzheimer's disease Maternal Grandfather     90's  . Cancer Paternal Grandmother     tumor on her back (?), didn't recur    Outpatient Encounter Prescriptions as of 01/12/2015  Medication Sig Note  . atorvastatin (LIPITOR) 40 MG tablet TAKE 1 TABLET DAILY   . b complex vitamins tablet Take 1 tablet by mouth daily.   . Cholecalciferol (VITAMIN D3 PO) Take 5,000 Int'l Units  by mouth daily. 01/12/2015: He takes 5000 IU drop every day, plus wears a patch (500 IU--uses the patch inconsistently)  . Multiple Vitamins-Minerals (MULTIVITAMIN WITH MINERALS) tablet Take 1 tablet by mouth daily.     Marland Kitchen VITAMIN K PO Take 1 tablet by mouth daily.   . [DISCONTINUED] atorvastatin (LIPITOR) 40 MG tablet TAKE 1 TABLET DAILY 01/12/2015: Admits to being somewhat inconsistent, missing 2x/week  . [DISCONTINUED] Calcium-Magnesium-Vitamin D (CALCIUM 500 PO) Take 2 tablets by mouth daily.     No Known Allergies  ROS:  The patient denies anorexia, fever, headaches,  vision loss (notes some perceived blurriness due to dominant eye being 20/20, nondominant 20/15 since LASIK), decreased hearing, ear pain, hoarseness, chest pain, palpitations, dizziness, syncope, dyspnea on exertion, cough, swelling, nausea, vomiting, diarrhea, constipation, abdominal pain, melena, hematochezia, indigestion/heartburn, hematuria, incontinence, erectile dysfunction, nocturia, weakened urine stream, dysuria, genital lesions, joint pains, numbness, tingling, weakness, tremor, suspicious skin lesions, depression, anxiety, abnormal bleeding/bruising, or enlarged lymph nodes. +intentional weight loss. Stools are softer since surgery, not loose. Some mild nasal congestion. Stressful job (since promotion)   PHYSICAL EXAM:  BP 128/72 mmHg  Pulse 68  Ht 5' 9.5" (1.765 m)  Wt 252 lb 3.2 oz (114.397 kg)  BMI 36.72 kg/m2  General Appearance:    Alert, cooperative, no distress, appears stated age  Head:    Normocephalic, without obvious abnormality, atraumatic  Eyes:    PERRL, conjunctiva/corneas clear, EOM's intact, fundi    benign  Ears:    Normal TM's and external ear canals  Nose:   Nares normal, mucosa mildly edematous bilaterally, no drainage or sinus tenderness  Throat:   Lips, mucosa, and tongue normal; teeth and gums normal  Neck:   Supple, no lymphadenopathy;  thyroid:  no   enlargement/tenderness/nodules;  no carotid   bruit or JVD  Back:    Spine nontender, no curvature, ROM normal, no CVA     tenderness  Lungs:     Clear to auscultation bilaterally without wheezes, rales or     ronchi; respirations unlabored  Chest Wall:    No tenderness. 2cm soft tissue mass just below the left clavicle, nontender   Heart:    Regular rate and rhythm, S1 and S2 normal, no murmur, rub   or gallop  Breast Exam:    No chest wall tenderness, masses or gynecomastia  Abdomen:     Soft, non-tender, nondistended, normoactive bowel sounds,    no masses, no hepatosplenomegaly  Genitalia:    Normal male external genitalia without lesions.  Testicles without masses.  No inguinal hernias.  Rectal:    Normal sphincter tone, no masses or tenderness; guaiac negative stool.  Prostate smooth, no nodules, not enlarged.  Extremities:   No clubbing, cyanosis or edema  Pulses:   2+ and symmetric all extremities  Skin:  Skin color, texture, turgor normal, no rashes or lesions. Birthmark ,medial right knee (cluster of flat red areas)  Lymph nodes:   Cervical, supraclavicular, and axillary nodes normal  Neurologic:   CNII-XII intact, normal strength, sensation and gait; reflexes 2+ and symmetric throughout          Psych:   Normal mood, affect, hygiene and grooming.         Chemistry      Component Value Date/Time   NA 137 01/09/2015 0001   K 3.9 01/09/2015 0001   CL 99 01/09/2015 0001   CO2 26 01/09/2015 0001   BUN 11 01/09/2015 0001   CREATININE 0.78 01/09/2015 0001      Component Value Date/Time   CALCIUM 8.8 01/09/2015 0001   ALKPHOS 64 01/09/2015 0001   AST 28 01/09/2015 0001   ALT 43 01/09/2015 0001   BILITOT 0.8 01/09/2015 0001     Glucose 81  Lab Results  Component Value Date   CHOL 143 01/09/2015   HDL 40 01/09/2015   LDLCALC 82 01/09/2015   TRIG 105 01/09/2015   CHOLHDL 3.6 01/09/2015   Lab Results  Component Value Date   TSH 3.899 01/09/2015   Lab Results  Component Value Date   WBC 7.6  01/09/2015   HGB 15.5 01/09/2015   HCT 44.1 01/09/2015   MCV 85.8 01/09/2015   PLT 245 01/09/2015   ASSESSMENT/PLAN:  Annual physical exam  Pure hypercholesterolemia - at goal.  HDL dropped since decrease in exercise. continue current meds; increase exercise - Plan: atorvastatin (LIPITOR) 40 MG tablet, Lipid panel, Hepatic function panel  Obstructive sleep apnea - continue CPAP.  likely will need a titration study done after his weight loss (per bariatric doctor)  Morbid obesity - improving s/p bariatric surgery  Pure hypercholesterolemia - well controlled on lipitor 40mg ; tolerating without side effects. reviewed low cholesterol diet in detail - Plan: atorvastatin (LIPITOR) 40 MG tablet, Lipid panel, Hepatic function panel  Impaired fasting glucose - resolved with weight loss - Plan: Glucose, random  Hypothyroidism, unspecified hypothyroidism type - TSH is gradually creeping up.  recheck in 6 months - Plan: TSH   Recommended at least 30 minutes of aerobic activity at least 5 days/week, weight-bearing exercise at least 2x/week; proper sunscreen use reviewed; healthy diet and alcohol recommendations (less than or equal to 2 drinks/day) reviewed; regular seatbelt use; changing batteries in smoke detectors. Self-testicular exams. Immunization recommendations discussed.  Colonoscopy recommendations reviewed--age 42.   6 months--glu, LFT's, lipids, TSH prior

## 2015-01-12 NOTE — Addendum Note (Signed)
Addended by: Debbrah AlarSMITH, Shloima Clinch F on: 01/12/2015 03:04 PM   Modules accepted: Orders

## 2015-01-12 NOTE — Patient Instructions (Signed)

## 2015-01-19 ENCOUNTER — Encounter: Payer: Self-pay | Admitting: Family Medicine

## 2015-07-10 ENCOUNTER — Other Ambulatory Visit: Payer: 59

## 2015-07-13 ENCOUNTER — Other Ambulatory Visit: Payer: 59

## 2015-07-14 ENCOUNTER — Encounter: Payer: 59 | Admitting: Family Medicine

## 2015-07-15 ENCOUNTER — Encounter: Payer: 59 | Admitting: Family Medicine

## 2015-08-28 ENCOUNTER — Other Ambulatory Visit: Payer: Self-pay | Admitting: Family Medicine

## 2015-11-29 ENCOUNTER — Other Ambulatory Visit: Payer: Self-pay | Admitting: Family Medicine

## 2015-12-15 ENCOUNTER — Telehealth: Payer: Self-pay | Admitting: Family Medicine

## 2015-12-15 NOTE — Telephone Encounter (Signed)
Will let Alvino ChapelJo know.  I need to modify the future orders anyway--the ones in the system were placed 01/2015 and were due in 07/2015.  He never had those done

## 2015-12-15 NOTE — Telephone Encounter (Signed)
Pt's Bariatric doctor wants additional labs drawn when he has labs drawn here.  He will email order to us

## 2015-12-15 NOTE — Telephone Encounter (Signed)
Copy of email for lab orders in your folder

## 2015-12-16 ENCOUNTER — Other Ambulatory Visit: Payer: Self-pay | Admitting: *Deleted

## 2015-12-16 DIAGNOSIS — K912 Postsurgical malabsorption, not elsewhere classified: Secondary | ICD-10-CM

## 2015-12-16 DIAGNOSIS — Z9884 Bariatric surgery status: Secondary | ICD-10-CM

## 2015-12-16 DIAGNOSIS — E7800 Pure hypercholesterolemia, unspecified: Secondary | ICD-10-CM

## 2015-12-16 NOTE — Telephone Encounter (Signed)
Put all orders in and let patient know.

## 2015-12-16 NOTE — Telephone Encounter (Signed)
Long list of labs, with dx codes.  Okay to have these (?do they need to be entered in epic, or present this lab order to [facility]?). Please change the future order for LFT's to c-met (since these future orders were placed for labs to be done in October, not for his physical). Others are fine

## 2015-12-23 ENCOUNTER — Other Ambulatory Visit: Payer: Self-pay

## 2015-12-24 ENCOUNTER — Encounter: Payer: Self-pay | Admitting: Family Medicine

## 2016-02-04 ENCOUNTER — Encounter: Payer: Self-pay | Admitting: Family Medicine

## 2016-02-04 ENCOUNTER — Ambulatory Visit
Admission: RE | Admit: 2016-02-04 | Discharge: 2016-02-04 | Disposition: A | Discharge status: Home / Self Care | Payer: Managed Care, Other (non HMO) | Source: Ambulatory Visit | Attending: Family Medicine | Admitting: Family Medicine

## 2016-02-04 ENCOUNTER — Ambulatory Visit (INDEPENDENT_AMBULATORY_CARE_PROVIDER_SITE_OTHER): Payer: Managed Care, Other (non HMO) | Admitting: Family Medicine

## 2016-02-04 VITALS — BP 126/70 | HR 87 | Wt 271.0 lb

## 2016-02-04 DIAGNOSIS — M25512 Pain in left shoulder: Secondary | ICD-10-CM

## 2016-02-04 NOTE — Progress Notes (Signed)
Subjective:     Patient ID: Juan Santiago, male   DOB: 1971-09-09, 45 y.o.   MRN: [id number]  HPI Mr. Renaldo FiddlerDalsen presents to clinic with a 1 week history of left shoulder stiffness that has progressed over the past few days to pain over the left deltoid region with any arm movement.  He does not recall any trauma or strenuous activity preceding the stiffness or pain.  He sleeps on his left side and first noticed the stiffness that limited all shoulder movement when waking up in the morning.  He then began to notice a sharp mid-humerus pain with any arm movement a few days later.  He as been taking tylenol for the pain because of a contraindication to NSAIDs due to ulceration risk ,post gastric sleeve surgery. He does computer work but nothing strenuous physically.  Review of Systems     Objective:   Physical Exam   Physical exam of left arm/shoulder severely limited due to pain.No palpable tenderness Severely limited active ROM in all directions and limited passive ROM on exam. No left deltoid atrophy and sensation over deltoid intact.  Weakness with abduction and external rotation.   Could not perform Neers or Hawkins test due to pain.    Assessment:     Left shoulder pain - Plan: DG Shoulder Left  Will send out for X-Ray today to assess for any bony abnormalities and follow up later today or tomorrow with next steps.  Discussed alternative pain medications including celecoxib but will continue tylenol for pain for the time being due to ulcer risk.      History and physical exam conducted by Emerson Monteaniel Malek Skog (Medical Student) in conjunction with Dr. Susann GivensLalonde.

## 2016-02-05 ENCOUNTER — Ambulatory Visit (INDEPENDENT_AMBULATORY_CARE_PROVIDER_SITE_OTHER): Payer: Managed Care, Other (non HMO) | Admitting: Family Medicine

## 2016-02-05 DIAGNOSIS — M25512 Pain in left shoulder: Secondary | ICD-10-CM

## 2016-02-05 DIAGNOSIS — M7582 Other shoulder lesions, left shoulder: Secondary | ICD-10-CM

## 2016-02-05 MED ORDER — LIDOCAINE HCL (PF) 1 % IJ SOLN
2.0000 mL | Freq: Once | INTRAMUSCULAR | Status: AC
  Administered 2016-02-05: 2 mL via INTRADERMAL

## 2016-02-05 MED ORDER — TRIAMCINOLONE ACETONIDE 40 MG/ML IJ SUSP
40.0000 mg | Freq: Once | INTRAMUSCULAR | Status: AC
  Administered 2016-02-05: 40 mg via INTRAMUSCULAR

## 2016-02-05 NOTE — Progress Notes (Signed)
   Subjective:    Patient ID: Juan Santiago, male    DOB: July 06, 1971, 45 y.o.   MRN: [id number]  HPI He is here for follow-up visit after recent x-rays did show some minor changes specifically in the area of the insertion of the supraspinatus tendon. He continues to complain of limited range of motion due to pain.   Review of Systems     Objective:   Physical Exam Alert and in no distress.       Assessment & Plan:  Left shoulder pain - Plan: triamcinolone acetonide (KENALOG-40) injection 40 mg, lidocaine (PF) (XYLOCAINE) 1 % injection 2 mL  Rotator cuff tendinitis, left discussed options with him concerning this and we have elected to inject the subacromial bursa. The posterolateral aspect of the left shoulder was cleaned with Betadine. 3 mL of Xylocaine and 40 mg of Kenalog was injected in the subacromial bursa without difficulty. Within several minutes he states that 90% of his pain was gone and he demonstrated much better range of motion of the shoulder. Explained that as long as this lasts for several months, there should be no problem with doing this again however if it lasts for a shorter period of time, further evaluation may be needed.

## 2016-02-18 ENCOUNTER — Other Ambulatory Visit: Payer: Self-pay | Admitting: Family Medicine

## 2016-02-18 ENCOUNTER — Other Ambulatory Visit: Payer: Managed Care, Other (non HMO)

## 2016-02-18 DIAGNOSIS — Z9884 Bariatric surgery status: Secondary | ICD-10-CM

## 2016-02-18 DIAGNOSIS — E7800 Pure hypercholesterolemia, unspecified: Secondary | ICD-10-CM

## 2016-02-18 DIAGNOSIS — K912 Postsurgical malabsorption, not elsewhere classified: Secondary | ICD-10-CM

## 2016-02-18 LAB — MAGNESIUM: Magnesium: 2.2 mg/dL (ref 1.5–2.5)

## 2016-02-18 LAB — CBC WITH DIFFERENTIAL/PLATELET
BASOS ABS: 0 {cells}/uL (ref 0–200)
Basophils Relative: 0 %
EOS PCT: 2 %
Eosinophils Absolute: 194 cells/uL (ref 15–500)
HCT: 46.4 % (ref 38.5–50.0)
HEMOGLOBIN: 15.9 g/dL (ref 13.2–17.1)
LYMPHS ABS: 3201 {cells}/uL (ref 850–3900)
Lymphocytes Relative: 33 %
MCH: 30.1 pg (ref 27.0–33.0)
MCHC: 34.3 g/dL (ref 32.0–36.0)
MCV: 87.9 fL (ref 80.0–100.0)
MPV: 9.3 fL (ref 7.5–12.5)
Monocytes Absolute: 679 cells/uL (ref 200–950)
Monocytes Relative: 7 %
NEUTROS ABS: 5626 {cells}/uL (ref 1500–7800)
Neutrophils Relative %: 58 %
Platelets: 302 10*3/uL (ref 140–400)
RBC: 5.28 MIL/uL (ref 4.20–5.80)
RDW: 13.2 % (ref 11.0–15.0)
WBC: 9.7 10*3/uL (ref 4.0–10.5)

## 2016-02-18 LAB — COMPREHENSIVE METABOLIC PANEL
ALK PHOS: 66 U/L (ref 40–115)
ALT: 45 U/L (ref 9–46)
AST: 29 U/L (ref 10–40)
Albumin: 4.3 g/dL (ref 3.6–5.1)
BUN: 12 mg/dL (ref 7–25)
CALCIUM: 8.8 mg/dL (ref 8.6–10.3)
CHLORIDE: 101 mmol/L (ref 98–110)
CO2: 24 mmol/L (ref 20–31)
Creat: 0.77 mg/dL (ref 0.60–1.35)
GLUCOSE: 81 mg/dL (ref 65–99)
POTASSIUM: 3.9 mmol/L (ref 3.5–5.3)
Sodium: 138 mmol/L (ref 135–146)
TOTAL PROTEIN: 6.6 g/dL (ref 6.1–8.1)
Total Bilirubin: 0.8 mg/dL (ref 0.2–1.2)

## 2016-02-18 LAB — FERRITIN: FERRITIN: 169 ng/mL (ref 20–380)

## 2016-02-18 LAB — HEMOGLOBIN A1C
Hgb A1c MFr Bld: 5.1 % (ref <5.7)
Mean Plasma Glucose: 100 mg/dL

## 2016-02-18 LAB — IRON: Iron: 195 ug/dL — ABNORMAL HIGH (ref 50–180)

## 2016-02-18 LAB — VITAMIN B12: VITAMIN B 12: 1635 pg/mL — AB (ref 200–1100)

## 2016-02-18 LAB — FOLATE: Folate: 9.2 ng/mL (ref >5.4)

## 2016-02-19 LAB — VITAMIN D 25 HYDROXY (VIT D DEFICIENCY, FRACTURES): VIT D 25 HYDROXY: 24 ng/mL — AB (ref 30–100)

## 2016-02-19 LAB — PTH, INTACT AND CALCIUM
Calcium: 8.8 mg/dL (ref 8.4–10.5)
PTH: 72 pg/mL — AB (ref 14–64)

## 2016-02-19 LAB — PREALBUMIN: Prealbumin: 28 mg/dL (ref 21–43)

## 2016-02-21 LAB — ZINC: Zinc: 81 ug/dL (ref 60–130)

## 2016-02-22 ENCOUNTER — Encounter: Payer: Self-pay | Admitting: Family Medicine

## 2016-02-22 ENCOUNTER — Ambulatory Visit (INDEPENDENT_AMBULATORY_CARE_PROVIDER_SITE_OTHER): Payer: Managed Care, Other (non HMO) | Admitting: Family Medicine

## 2016-02-22 VITALS — BP 124/76 | HR 84 | Ht 69.5 in | Wt 267.0 lb

## 2016-02-22 DIAGNOSIS — E559 Vitamin D deficiency, unspecified: Secondary | ICD-10-CM

## 2016-02-22 DIAGNOSIS — G4733 Obstructive sleep apnea (adult) (pediatric): Secondary | ICD-10-CM | POA: Diagnosis not present

## 2016-02-22 DIAGNOSIS — E7800 Pure hypercholesterolemia, unspecified: Secondary | ICD-10-CM

## 2016-02-22 LAB — VITAMIN B1: Vitamin B1 (Thiamine): 20 nmol/L (ref 8–30)

## 2016-02-22 LAB — CERULOPLASMIN: CERULOPLASMIN: 22 mg/dL (ref 18–36)

## 2016-02-22 MED ORDER — VITAMIN D (ERGOCALCIFEROL) 1.25 MG (50000 UNIT) PO CAPS: 50000.0000 [IU] | ORAL_CAPSULE | ORAL | Status: AC

## 2016-02-22 NOTE — Progress Notes (Signed)
Chief Complaint  Patient presents with  . Hyperlipidemia    nonfasting med check. No concerns other than he is gaining weight.    He noticed a lump below the left clavicle since he lost weight.  Nontender, no change in size since first noticing. No known h/o trauma/injury.  No other lumps or concerns.  He was seen earlier this month after waking up with left shoulder pain.  He is much better after steroid injection from Dr. Susann Givens.  OSA: He didn't use machine as much, as supplies were very expensive. Insurance changed and he is now trying to find where he can get supplies.  Has been off CPAP for a couple of months (sporadic over the last year). +snoring per wife. Doesn't wake up feeling unrefreshed.  Wife doesn't report apnea spells. Hasn't had sleep study since his weight loss, per pt.  Hypertension: He has been off medications since bariatric surgery. BPs aren't checked elsewhere. No headaches, chest pain, palpitations, edema. Was very anxious about daughter driving to [place] by herself (and dog), but just learned that she made it there safely. She graduated early, and will be teaching white water rafting at [employer]. She had been living at home much of her last year (rather than at [place]).  Hyperlipidemia follow-up:  He reports compliance with medications and denies medication side effects. He admits that his diet hasn't been as good since mother is in town visiting, eating [place] food.  Obesity--s/p bariatric surgery. He reports regaining some weight, and that his bariatric doctor (Dr. Smitty Cords) would like him back under 250#.  He had labs drawn for Dr. Smitty Cords through our office, results to be sent to him.  Lipids were inadvertantly left off, so results aren't available at the time of his visit.  Wt Readings from Last 3 Encounters:  02/22/16 267 lb (121.11 kg)  02/04/16 271 lb (122.925 kg)  01/12/15 252 lb 3.2 oz (114.397 kg)    Exercise--gym 4-5 days/week, hasn't gone in 3-4 weeks  related to travel to [place] and parents visiting. Plans to restart soon.  PMH, PSH, SH reviewed/updated  Outpatient Encounter Prescriptions as of 02/22/2016  Medication Sig Note  . atorvastatin (LIPITOR) 40 MG tablet TAKE 1 TABLET DAILY (NEED APPOINTMENT BEFORE ANYMORE REFILLS WILL BE GIVEN)   . Multiple Vitamins-Minerals (MULTIVITAMIN WITH MINERALS) tablet Take 1 tablet by mouth daily.     . Vitamin D, Ergocalciferol, (DRISDOL) 50000 units CAPS capsule Take 1 capsule (50,000 Units total) by mouth every 7 (seven) days.   . [DISCONTINUED] b complex vitamins tablet Take 1 tablet by mouth daily.   . [DISCONTINUED] Cholecalciferol (VITAMIN D3 PO) Take 5,000 Int'l Units by mouth daily. 01/12/2015: He takes 5000 IU drop every day, plus wears a patch (500 IU--uses the patch inconsistently)  . [DISCONTINUED] VITAMIN K PO Take 1 tablet by mouth daily. Reported on 02/04/2016    No facility-administered encounter medications on file as of 02/22/2016.   Vitamin D was rx'd at today's visit, not prior; hasn't been taking Vitamin D supplement.  No Known Allergies  ROS: no fever, chills, headaches, dizziness, chest pain, cough, shortness of breath, nausea, vomiting, bowel changes, urinary complaints, bleeding, bruising, rash. Denies joint pains (shoulder pain resolved), edema, or other concerns. Anxiety related to daughter's travel is improved/resolved.  PHYSICAL EXAM: BP 138/80 mmHg  Pulse 84  Ht 5' 9.5" (1.765 m)  Wt 267 lb (121.11 kg)  BMI 38.88 kg/m2  124/76 on repeat by MD Well developed, pleasant male, in good spirits,  accompanied by his wife, in no distress HEENT: PERRL, EOMI, conjunctiva and sclera are clear. OP clear Neck: no lymphadenopathy, thyromegaly or mass Chest wall: 2cm soft tissue mass just below medial aspect of left clavicle; nontender; no erythema, fluctuance. Heart: regular rate and rhythm Lungs: clear bilaterally Back: no spinal or CVA tenderness Abdomen: WHSS, nontender, normal  bowel sounds, no organomegaly or mass Extremities: no edema Psych: normal mood, affect, hygiene and grooming Neuro: alert and oriented, cranial nerves intact. Normal gait, strength     Chemistry      Component Value Date/Time   NA 138 02/18/2016 0727   NA 137 05/20/2013 1015   K 3.9 02/18/2016 0727   K 3.9 05/20/2013 1015   CL 101 02/18/2016 0727   CL 105 05/20/2013 1015   CO2 24 02/18/2016 0727   CO2 25 05/20/2013 1015   BUN 12 02/18/2016 0727   BUN 10 05/20/2013 1015   CREATININE 0.77 02/18/2016 0727   CREATININE 0.84 05/20/2013 1015      Component Value Date/Time   CALCIUM 8.8 02/18/2016 0727   CALCIUM 8.8 02/18/2016 0727   CALCIUM 9.0 05/20/2013 1015   ALKPHOS 66 02/18/2016 0727   ALKPHOS 62 05/20/2013 1015   AST 29 02/18/2016 0727   AST 38* 05/20/2013 1015   ALT 45 02/18/2016 0727   ALT 58 05/20/2013 1015   BILITOT 0.8 02/18/2016 0727   BILITOT 0.6 05/20/2013 1015     Fasting glucose 81  Lab Results  Component Value Date   HGBA1C 5.1 02/18/2016   Lab Results  Component Value Date   IRON 195* 02/18/2016   TIBC 306 05/20/2013   FERRITIN 169 02/18/2016   Lab Results  Component Value Date   WBC 9.7 02/18/2016   HGB 15.9 02/18/2016   HCT 46.4 02/18/2016   MCV 87.9 02/18/2016   PLT 302 02/18/2016    Vitamin D-OH 24 (low)  Mg 2.2 Prealbumin 28 Thiamine 20 Folate 9.2 B12 1635 Ceruloplasmin 22 Zinc 81   ASSESSMENT/PLAN:  Pure hypercholesterolemia - add on lipids to labs drawn; continue atorvastatin.  continue lowfat, low cholesterol diet (at goal in the past on current meds)  Obstructive sleep apnea - pt to get new supplies and restart CPAP use--I recommend repeat sleep study and CPAP titration, s/p weight loss. Can address with his surgeon as well  Vitamin D deficiency - labs sent to bariatric surgeon.  replace with weekly rx Vitamin D - Plan: Vitamin D, Ergocalciferol, (DRISDOL) 50000 units CAPS capsule  Morbid obesity, unspecified obesity  type (HCC) - improve diet, resume exercise. resume regular supplements (s/p bariatric surgery, to avoid malabsorption of nutrients).  Labs will be sent to Dr. Smitty CordsBruce   [facility]. Send results Some still pending.  Discussed abnormals Replace Vitamin D--he will check with Dr. Smitty CordsBruce if alterantive treatment is preferred.  Lipids not done--add on   OSA-- Call your insurance or check with [facility] to see if they take it. Given that you haven't had sleep study since you lost the weight, it probably is a good idea to have a CPAP titration study done (vs another sleep study).  Check with [employer] to see if sleep study needs to be done at sleep center vs at home.

## 2016-02-22 NOTE — Patient Instructions (Addendum)
Continue your current medications. I will send refills of atorvastatin when I see your cholesterol results.  Sleep apnea: Call your insurance or check with [facility] to see if they take it. Given that you haven't had sleep study since you lost the weight, it probably is a good idea to have a CPAP titration study done (vs another sleep study).  Check with [employer] to see if sleep study needs to be done at sleep center vs at home.

## 2016-02-23 LAB — LIPID PANEL
CHOLESTEROL: 209 mg/dL — AB (ref 125–200)
HDL: 51 mg/dL (ref >=40)
LDL Cholesterol: 123 mg/dL (ref <130)
TRIGLYCERIDES: 173 mg/dL — AB (ref <150)
Total CHOL/HDL Ratio: 4.1 Ratio (ref <=5.0)
VLDL: 35 mg/dL — ABNORMAL HIGH (ref <30)

## 2016-02-26 LAB — VITAMIN K1, SERUM: VITAMIN K: 483 pg/mL (ref 80–1160)

## 2016-02-28 LAB — VITAMIN E
Gamma-Tocopherol (Vit E): <1 mg/L (ref <=4.3)
Vitamin E (Alpha Tocopherol): 16.8 mg/L (ref 5.7–19.9)

## 2016-03-03 LAB — VITAMIN A: VITAMIN A (RETINOIC ACID): 56 ug/dL (ref 38–98)

## 2016-05-14 IMAGING — CR DG SHOULDER 2+V*L*
3 series · 3 of 3 positions shown · non-contrast
Comparison: None.

CLINICAL DATA: But shoulder pain for 1 week, no known injury,
initial encounter

EXAM:
LEFT SHOULDER - 2+ VIEW

[w shoulder ap internal left]
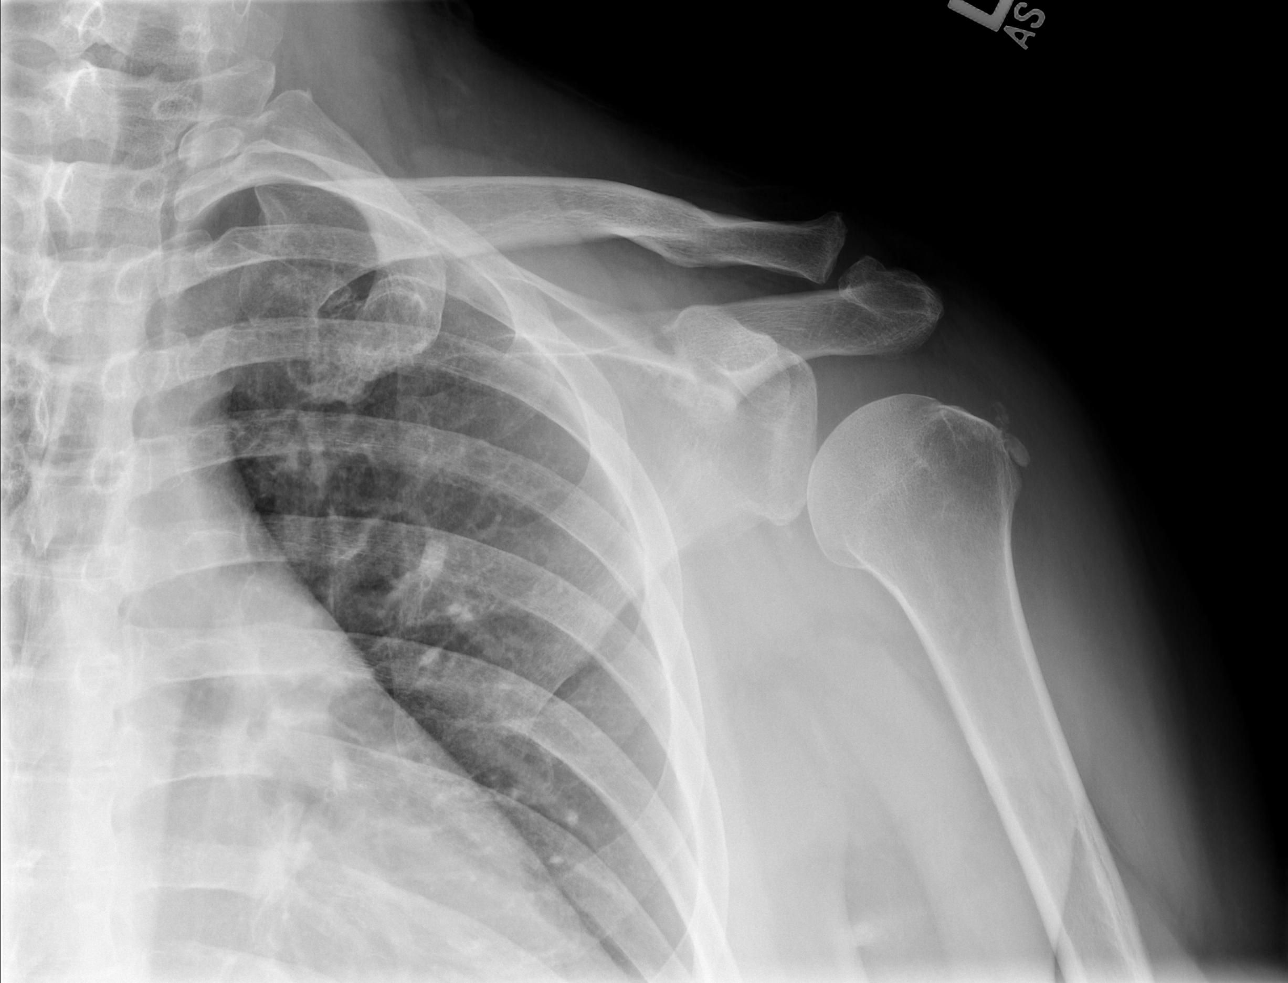

[w shoulder y view left *]
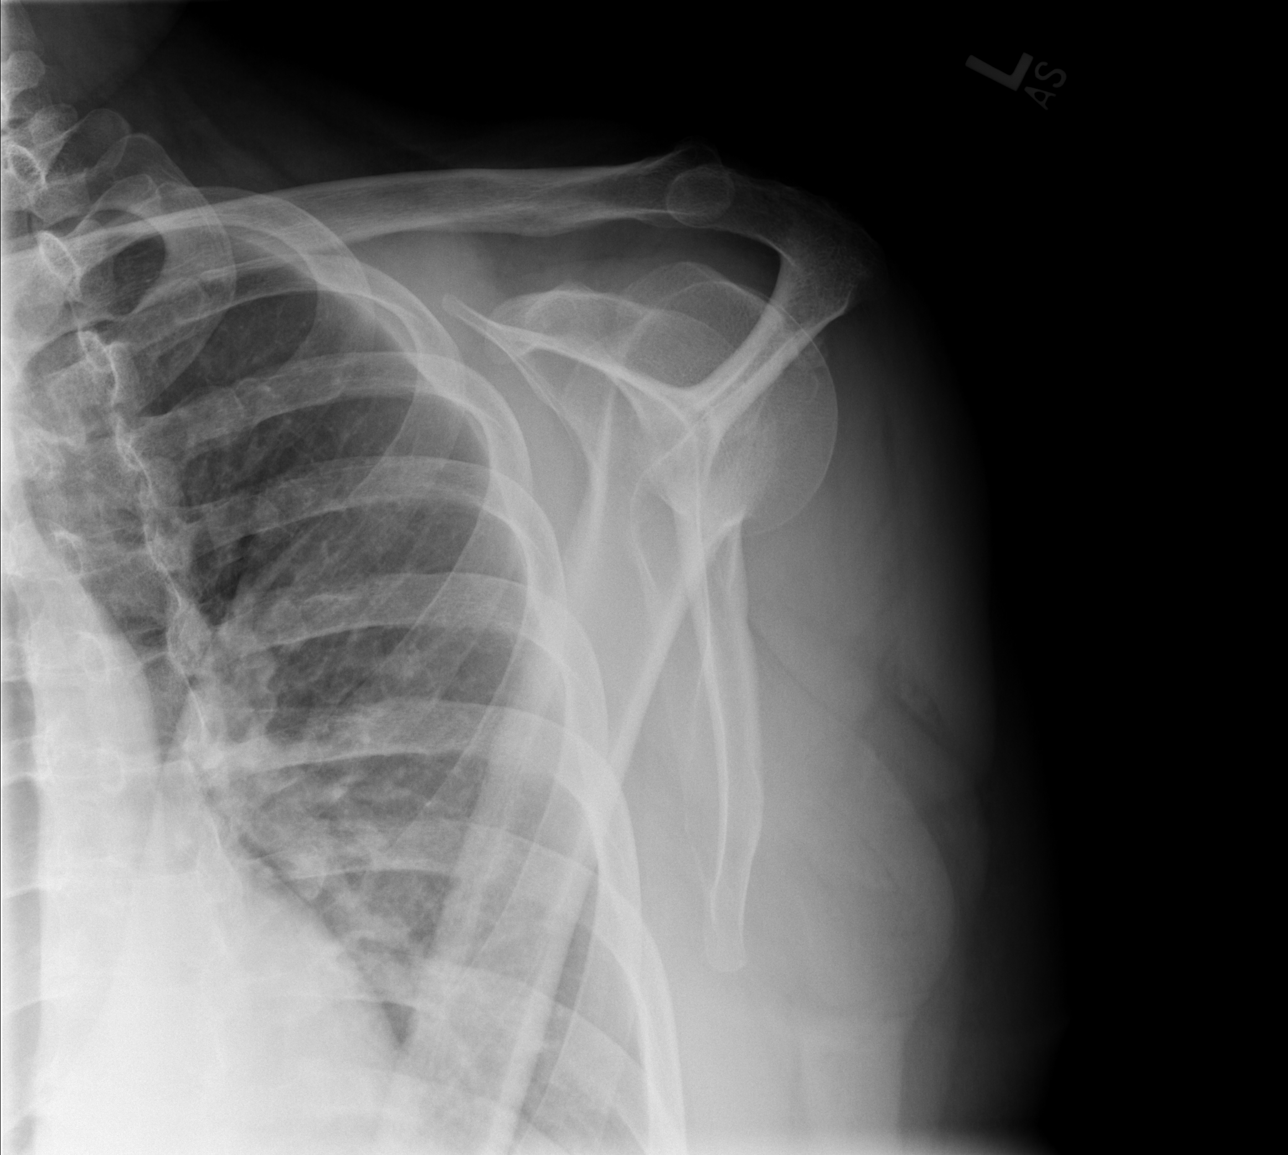

[w shoulder axillary left *]
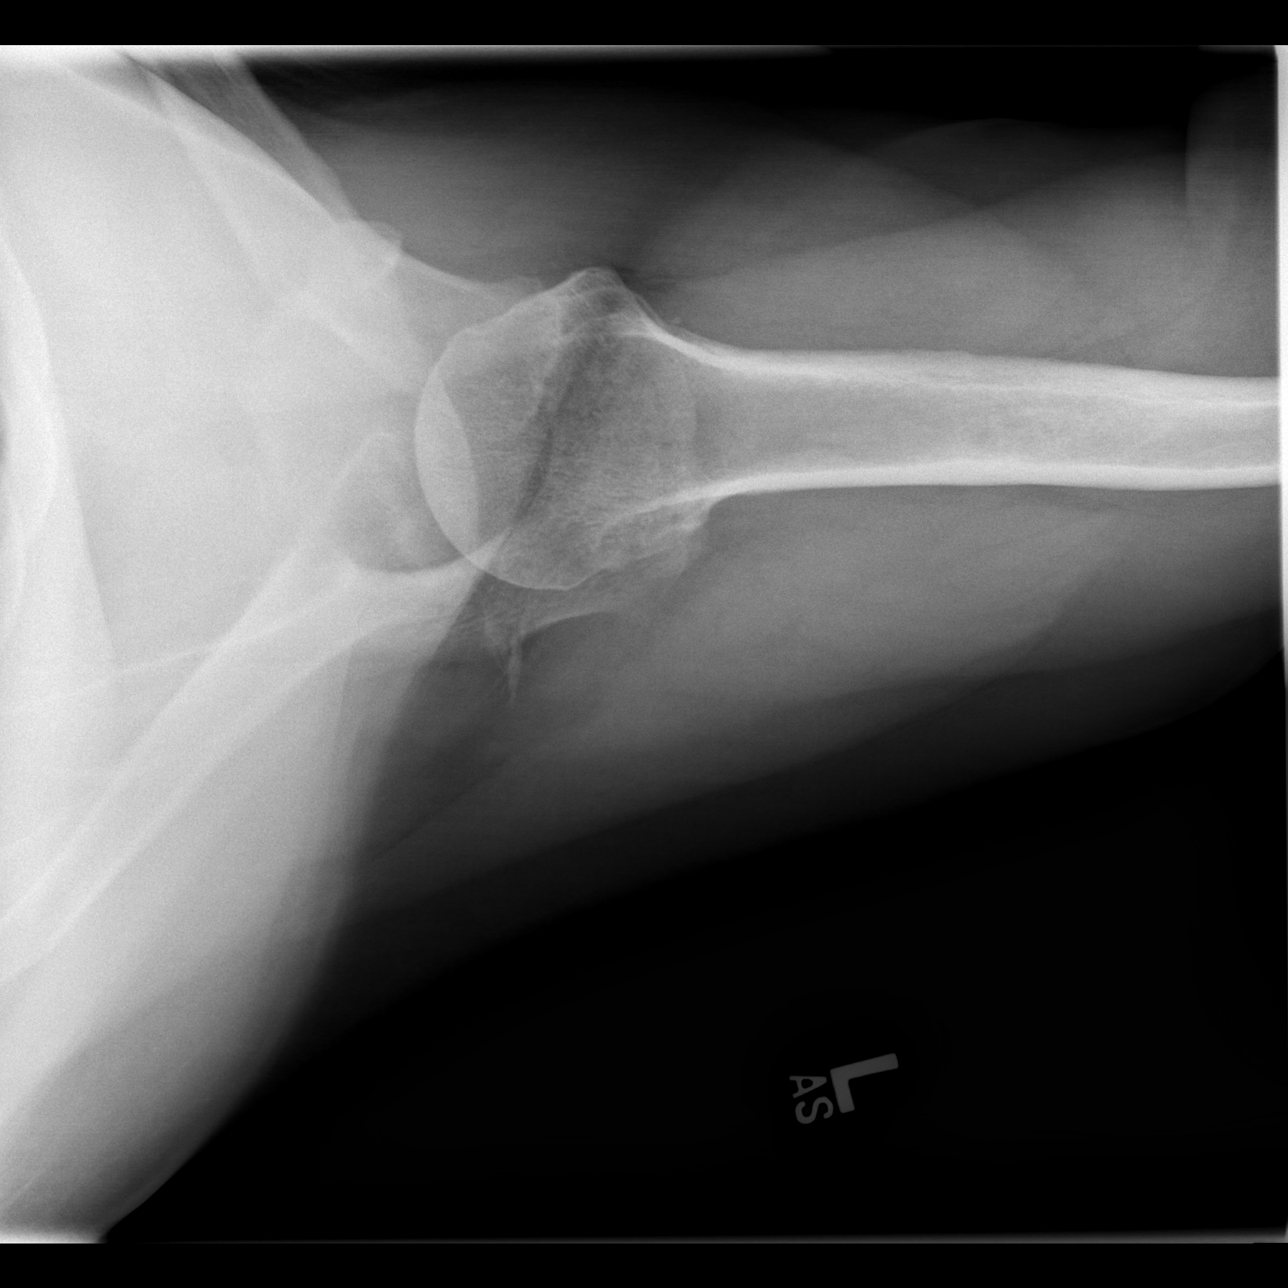

[3 of 3 positions shown; findings below may reference images not displayed]

FINDINGS: No acute fracture or dislocation is noted. Degenerative changes of
the acromioclavicular joint are seen. Mild calcifications are noted
at the insertion of supraspinatus tendon. No acute abnormality is
noted.
IMPRESSION: Chronic changes without acute abnormality.

## 2016-05-25 ENCOUNTER — Other Ambulatory Visit: Payer: Self-pay | Admitting: Family Medicine

## 2016-05-25 DIAGNOSIS — E559 Vitamin D deficiency, unspecified: Secondary | ICD-10-CM

## 2016-05-25 NOTE — Telephone Encounter (Signed)
Suspect auto-refill Deny. Be sure that he has a plan from his other docs as far as how much daily vitamin D supplementation they want him taking longterm (I usually recommend 1000-2000 IU daily).

## 2016-05-25 NOTE — Telephone Encounter (Signed)
Wasn't sure if he was taking this long term due to his bariatric surgery?

## 2017-05-11 ENCOUNTER — Telehealth: Payer: Self-pay

## 2017-05-11 NOTE — Telephone Encounter (Signed)
[facility] since [facility] did not receive original fax. Trixie Rude/RLB

## 2017-06-19 ENCOUNTER — Encounter: Payer: Self-pay | Admitting: *Deleted
